# Patient Record
Sex: Female | Born: 1995 | Race: Black or African American | Hispanic: No | Marital: Single | Smoking: Current every day smoker
Health system: Southern US, Community
[De-identification: ages and names within clinical notes are randomized; demographics above are authoritative.]

## PROBLEM LIST (undated history)

## (undated) ENCOUNTER — Inpatient Hospital Stay (HOSPITAL_COMMUNITY): Payer: Self-pay

## (undated) DIAGNOSIS — N390 Urinary tract infection, site not specified: Secondary | ICD-10-CM

## (undated) HISTORY — PX: NO PAST SURGERIES: SHX2092

---
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
## 2013-02-12 ENCOUNTER — Emergency Department (HOSPITAL_COMMUNITY)
Admission: EM | Admit: 2013-02-12 | Discharge: 2013-02-12 | Disposition: A | Discharge status: Home / Self Care | Payer: Medicaid Other | Attending: Emergency Medicine | Admitting: Emergency Medicine

## 2013-02-12 ENCOUNTER — Encounter (HOSPITAL_COMMUNITY): Payer: Self-pay | Admitting: Emergency Medicine

## 2013-02-12 DIAGNOSIS — F17200 Nicotine dependence, unspecified, uncomplicated: Secondary | ICD-10-CM | POA: Insufficient documentation

## 2013-02-12 DIAGNOSIS — R51 Headache: Secondary | ICD-10-CM | POA: Insufficient documentation

## 2013-02-12 DIAGNOSIS — J029 Acute pharyngitis, unspecified: Secondary | ICD-10-CM

## 2013-02-12 DIAGNOSIS — Z87440 Personal history of urinary (tract) infections: Secondary | ICD-10-CM | POA: Insufficient documentation

## 2013-02-12 DIAGNOSIS — Z3202 Encounter for pregnancy test, result negative: Secondary | ICD-10-CM | POA: Insufficient documentation

## 2013-02-12 DIAGNOSIS — R6883 Chills (without fever): Secondary | ICD-10-CM | POA: Insufficient documentation

## 2013-02-12 DIAGNOSIS — H9209 Otalgia, unspecified ear: Secondary | ICD-10-CM | POA: Insufficient documentation

## 2013-02-12 LAB — RAPID STREP SCREEN (MED CTR MEBANE ONLY): Streptococcus, Group A Screen (Direct): NEGATIVE

## 2013-02-12 LAB — POCT PREGNANCY, URINE: PREG TEST UR: NEGATIVE

## 2013-02-12 MED ORDER — AMOXICILLIN 500 MG PO CAPS: 500.0000 mg | ORAL_CAPSULE | Freq: Three times a day (TID) | ORAL | Status: DC

## 2013-02-12 MED ORDER — IBUPROFEN 800 MG PO TABS: 800.0000 mg | ORAL_TABLET | Freq: Three times a day (TID) | ORAL | Status: DC

## 2013-02-12 NOTE — ED Provider Notes (Signed)
CSN: [id number]     Arrival date & time 02/12/13  1759 History   None    This chart was scribed for non-physician practitioner, Jaynie Crumbleatyana Josceline Chenard, PA-C, working with Leonette Mostharles B. Bernette MayersSheldon, MD by Arlan OrganAshley Leger, ED Scribe. This patient was seen in room TR09C/TR09C and the patient's care was started at 8:36 PM.   Chief Complaint  Patient presents with  . swollen tonsils    The history is provided by the patient. No language interpreter was used.    HPI Comments: Destiny Wu is a 18 y.o. female who presents to the Emergency Department complaining of bilateral swelling to the tonsils with associated diffuse facial pain described as sharp and stabbing that initially started 3 days. She states her symptoms started with a sore throat that gradually progressed into her current symptoms. She also reports mild dysphagia, mild chills, and mild otalgia. Pt states eating exacerbates her pain, and she denies any alleviating factors. She has tried 2 packs of BC powder, chloraseptic spray, and tylenol with no improvement. She denies any nasal congestion, fever, rhinorrhea, or cough. She denies ever having mononucleosis. Denies any known antibiotics. Pt has no other pertinent medical history, and no other complaints as this time.   Past Medical History  Diagnosis Date  . UTI (urinary tract infection)    Past Surgical History  Procedure Laterality Date  . No past surgeries     Family History  Problem Relation Age of Onset  . Diabetes Maternal Grandmother    History  Substance Use Topics  . Smoking status: Current Every Day Smoker -- 0.50 packs/day for 1 years    Types: Cigarettes  . Smokeless tobacco: Not on file  . Alcohol Use: No   OB History   Grav Para Term Preterm Abortions TAB SAB Ect Mult Living   1 1  1       0     Review of Systems  HENT: Negative for congestion, postnasal drip and rhinorrhea.        Swollen tonsils  Respiratory: Negative for cough.     Allergies  Review of  patient's allergies indicates no known allergies.  Home Medications   Current Outpatient Rx  Name  Route  Sig  Dispense  Refill  . acetaminophen (TYLENOL) 500 MG tablet   Oral   Take 1,000 mg by mouth every 6 (six) hours as needed for mild pain.         . Aspirin-Salicylamide-Caffeine (BC HEADACHE POWDER PO)   Oral   Take 1 packet by mouth daily as needed (for pain).         Marland Kitchen. ibuprofen (ADVIL,MOTRIN) 200 MG tablet   Oral   Take 400 mg by mouth daily as needed for moderate pain.           Triage Vitals: BP 120/60  Pulse 78  Temp(Src) 99.5 F (37.5 C) (Oral)  Resp 18  Wt 198 lb (89.812 kg)  SpO2 100%  Physical Exam  Nursing note and vitals reviewed. Constitutional: She is oriented to person, place, and time. She appears well-developed and well-nourished.  HENT:  Head: Normocephalic and atraumatic.  External ears normal bilaterally TMs normal bilaterally  No nasal congestion Tonsils bilaterally erythematous,  right greater than left White exudate present on right tonsil Uvula midline Tender anterior cervical lymphadenopathy   Eyes: EOM are normal.  Neck: Normal range of motion.  Cardiovascular: Normal rate and regular rhythm.   Pulmonary/Chest: Effort normal.  Musculoskeletal: Normal range of motion.  Neurological:  She is alert and oriented to person, place, and time.  Skin: Skin is warm and dry.  Psychiatric: She has a normal mood and affect. Her behavior is normal.    ED Course  Procedures (including critical care time)  DIAGNOSTIC STUDIES: Oxygen Saturation is 100% on RA, Normal by my interpretation.    COORDINATION OF CARE: 8:37 PM- Will put on antibiotic. Encouraged pt to continue to soak throat with warm water. Discussed treatment plan with pt at bedside and pt agreed to plan.     Labs Review Labs Reviewed  RAPID STREP SCREEN  CULTURE, GROUP A STREP  POCT PREGNANCY, URINE   Imaging Review No results found.  EKG Interpretation   None        MDM   1. Pharyngitis     Patient here for sore throat, also wants to know if she's pregnant. Patient exam is positive for slightly enlarged  right tonsil, no signs of peritonsillar abscess given there is no change in voice, her tonsils are still minimally enlarged, and the uvula is midline. Strep screen was obtained and is negative. Because patient has low-grade fever, tender anterior cervical lymphadenopathy, exudative tonsillitis, will treat with antibiotic. Patient's urine pregnancy is negative. Advised to take ibuprofen and Tylenol for pain, salt water gargles, Chloraseptic throat spray. She'll be started on amoxicillin. She is to followup with her primary care Dr.   Ceasar Mons Vitals:   02/12/13 1808  BP: 120/60  Pulse: 78  Temp: 99.5 F (37.5 C)  TempSrc: Oral  Resp: 18  Weight: 198 lb (89.812 kg)  SpO2: 100%    I personally performed the services described in this documentation, which was scribed in my presence. The recorded information has been reviewed and is accurate.   Lottie Mussel, PA-C 02/13/13 [phone]

## 2013-02-12 NOTE — ED Notes (Signed)
Pt c/o swollen tonsils for 2 days with total face pain.  No temp

## 2013-02-12 NOTE — Discharge Instructions (Signed)
Ibuprofen for pain. Amoxicillin for infection until all gone. Salt water gargles to help with pain and inflammation. Chloroseptic throat spray. Follow up with your doctor.   Pharyngitis Pharyngitis is redness, pain, and swelling (inflammation) of your pharynx.  CAUSES  Pharyngitis is usually caused by infection. Most of the time, these infections are from viruses (viral) and are part of a cold. However, sometimes pharyngitis is caused by bacteria (bacterial). Pharyngitis can also be caused by allergies. Viral pharyngitis may be spread from person to person by coughing, sneezing, and personal items or utensils (cups, forks, spoons, toothbrushes). Bacterial pharyngitis may be spread from person to person by more intimate contact, such as kissing.  SIGNS AND SYMPTOMS  Symptoms of pharyngitis include:   Sore throat.   Tiredness (fatigue).   Low-grade fever.   Headache.  Joint pain and muscle aches.  Skin rashes.  Swollen lymph nodes.  Plaque-like film on throat or tonsils (often seen with bacterial pharyngitis). DIAGNOSIS  Your health care provider will ask you questions about your illness and your symptoms. Your medical history, along with a physical exam, is often all that is needed to diagnose pharyngitis. Sometimes, a rapid strep test is done. Other lab tests may also be done, depending on the suspected cause.  TREATMENT  Viral pharyngitis will usually get better in 3 4 days without the use of medicine. Bacterial pharyngitis is treated with medicines that kill germs (antibiotics).  HOME CARE INSTRUCTIONS   Drink enough water and fluids to keep your urine clear or pale yellow.   Only take over-the-counter or prescription medicines as directed by your health care provider:   If you are prescribed antibiotics, make sure you finish them even if you start to feel better.   Do not take aspirin.   Get lots of rest.   Gargle with 8 oz of salt water ( tsp of salt per 1 qt of  water) as often as every 1 2 hours to soothe your throat.   Throat lozenges (if you are not at risk for choking) or sprays may be used to soothe your throat. SEEK MEDICAL CARE IF:   You have large, tender lumps in your neck.  You have a rash.  You cough up green, yellow-brown, or bloody spit. SEEK IMMEDIATE MEDICAL CARE IF:   Your neck becomes stiff.  You drool or are unable to swallow liquids.  You vomit or are unable to keep medicines or liquids down.  You have severe pain that does not go away with the use of recommended medicines.  You have trouble breathing (not caused by a stuffy nose). MAKE SURE YOU:   Understand these instructions.  Will watch your condition.  Will get help right away if you are not doing well or get worse. Document Released: 01/09/2005 Document Revised: 10/30/2012 Document Reviewed: 09/16/2012 [facility] Patient Information 2014 [employer], [place].

## 2013-02-13 NOTE — ED Provider Notes (Signed)
Medical screening examination/treatment/procedure(s) were performed by non-physician practitioner and as supervising physician I was immediately available for consultation/collaboration.  EKG Interpretation   None         Tikia Skilton B. Joeziah Voit, MD 02/13/13 0857 

## 2013-02-14 LAB — CULTURE, GROUP A STREP

## 2013-04-19 ENCOUNTER — Emergency Department (HOSPITAL_COMMUNITY)
Admission: EM | Admit: 2013-04-19 | Discharge: 2013-04-19 | Disposition: A | Discharge status: Home Health | Payer: Medicaid Other | Attending: Emergency Medicine | Admitting: Emergency Medicine

## 2013-04-19 ENCOUNTER — Encounter (HOSPITAL_COMMUNITY): Payer: Self-pay | Admitting: Emergency Medicine

## 2013-04-19 DIAGNOSIS — Z791 Long term (current) use of non-steroidal anti-inflammatories (NSAID): Secondary | ICD-10-CM | POA: Insufficient documentation

## 2013-04-19 DIAGNOSIS — R21 Rash and other nonspecific skin eruption: Secondary | ICD-10-CM

## 2013-04-19 DIAGNOSIS — Z792 Long term (current) use of antibiotics: Secondary | ICD-10-CM | POA: Insufficient documentation

## 2013-04-19 DIAGNOSIS — F17200 Nicotine dependence, unspecified, uncomplicated: Secondary | ICD-10-CM | POA: Insufficient documentation

## 2013-04-19 DIAGNOSIS — Z87440 Personal history of urinary (tract) infections: Secondary | ICD-10-CM | POA: Insufficient documentation

## 2013-04-19 MED ORDER — BETAMETHASONE DIPROPIONATE 0.05 % EX OINT: TOPICAL_OINTMENT | Freq: Two times a day (BID) | CUTANEOUS | Status: DC

## 2013-04-19 NOTE — Discharge Instructions (Signed)
Continue to use hydrocortisone cream (over the counter) on your rash  If you need a stronger steroid cream please fill prescribed prescription - do not use both OTC steroid and prescribed steroid cream - if you cannot afford the prescribed medication continue to use over the counter hydrocortisone daily (which will help) Continue to keep area moist to prevent cracking - try Aquaphor and Vaseline  Follow-up with a doctor - you will likely need dermatology referral  Return to the emergency department if you develop any changing/worsening condition, spreading redness/swelling, fever, severe pain, drainage, or any other concerns (please read additional information regarding your condition below)     Psoriasis Psoriasis is a common, long-lasting (chronic) inflammation of the skin. It affects both men and women equally, of all ages and all races. Psoriasis cannot be passed from person to person (not contagious). Psoriasis varies from mild to very severe. When severe, it can greatly affect your quality of life. Psoriasis is an inflammatory disorder affecting the skin as well as other organs including the joints (causing an arthritis). With psoriasis, the skin sheds its top layer of cells more rapidly than it does in someone without psoriasis. CAUSES  The cause of psoriasis is largely unknown. Genetics, your immune system, and the environment seem to play a role in causing psoriasis. Factors that can make psoriasis worse include:  Damage or trauma to the skin, such as cuts, scrapes, and sunburn. This damage often causes new areas of psoriasis (lesions).  Winter dryness and lack of sunlight.  Medicines such as lithium, beta-blockers, antimalarial drugs, ACE inhibitors, nonsteroidal anti-inflammatory drugs (ibuprofen, aspirin), and terbinafine. Let your caregiver know if you are taking any of these drugs.  Alcohol. Excessive alcohol use should be avoided if you have psoriasis. Drinking large amounts of  alcohol can affect:  How well your psoriasis treatment works.  How safe your psoriasis treatment is.  Smoking. If you smoke, ask your caregiver for help to quit.  Stress.  Bacterial or viral infections.  Arthritis. Arthritis associated with psoriasis (psoriatic arthritis) affects less than 10% of patients with psoriasis. The arthritic intensity does not always match the skin psoriasis intensity. It is important to let your caregiver know if your joints hurt or if they are stiff. SYMPTOMS  The most common form of psoriasis begins with little red bumps that gradually become larger. The bumps begin to form scales that flake off easily. The lower layers of scales stick together. When these scales are scratched or removed, the underlying skin is tender and bleeds easily. These areas then grow in size and may become large. Psoriasis often creates a rash that looks the same on both sides of the body (symmetrical). It often affects the elbows, knees, groin, genitals, arms, legs, scalp, and nails. Affected nails often have pitting, loosen, thicken, crumble, and are difficult to treat.  "Inverse psoriasis"occurs in the armpits, under breasts, in skin folds, and around the groin, buttocks, and genitals.  "Guttate psoriasis" generally occurs in children and young adults following a recent sore throat (strep throat). It begins with many small, red, scaly spots on the skin. It clears spontaneously in weeks or a few months without treatment. DIAGNOSIS  Psoriasis is diagnosed by physical exam. A tissue sample (biopsy) may also be taken. TREATMENT The treatment of psoriasis depends on your age, health, and living conditions.  Steroid (cortisone) creams, lotions, and ointments may be used. These treatments are associated with thinning of the skin, blood vessels that get larger (dilated), loss  of skin pigmentation, and easy bruising. It is important to use these steroids as directed by your caregiver. Only  treat the affected areas and not the normal, unaffected skin. People on long-term steroid treatment should wear a medical alert bracelet. Injections may be used in areas that are difficult to treat.  Scalp treatments are available as shampoos, solutions, sprays, foams, and oils. Avoid scratching the scalp and picking at the scales.  Anthralin medicine works well on areas that are difficult to treat. However, it stains clothes and skin and may cause temporary irritation.  Synthetic vitamin D (calcipotriene)can be used on small areas. It is available by prescription. The forms of synthetic vitamin D available in health food stores do not help with psoriasis.  Coal tarsare available in various strengths for psoriasis that is difficult to treat. They are one of the longest used treatments for difficult to treat psoriasis. However, they are messy to use.  Light therapy (UV therapy) can be carefully and professionally monitored in a dermatologist's office. Careful sunbathing is helpful for many people as directed by your caregiver. The exposure should be just long enough to cause a mild redness (erythema) of your skin. Avoid sunburn as this may make the condition worse. Sunscreen (SPF of 30 or higher) should be used to protect against sunburn. Cataracts, wrinkles, and skin aging are some of the harmful side effects of light therapy.  If creams (topical medicines) fail, there are several other options for systemic or oral medicines your caregiver can suggest. Psoriasis can sometimes be very difficult to treat. It can come and go. It is necessary to follow up with your caregiver regularly if your psoriasis is difficult to treat. Usually, with persistence you can get a good amount of relief. Maintaining consistent care is important. Do not change caregivers just because you do not see immediate results. It may take several trials to find the right combination of treatment for you. PREVENTING FLARE-UPS  Wear  gloves while you wash dishes, while cleaning, and when you are outside in the cold.  If you have radiators, place a bowl of water or damp towel on the radiator. This will help put water back in the air. You can also use a humidifier to keep the air moist. Try to keep the humidity at about 60% in your home.  Apply moisturizer while your skin is still damp from bathing or showering. This traps water in the skin.  Avoid long, hot baths or showers. Keep soap use to a minimum. Soaps dry out the skin and wash away the protective oils. Use a fragrance free, dye free soap.  Drink enough water and fluids to keep your urine clear or pale yellow. Not drinking enough water depletes your skin's water supply.  Turn off the heat at night and keep it low during the day. Cool air is less drying. SEEK MEDICAL CARE IF:  You have increasing pain in the affected areas.  You have uncontrolled bleeding in the affected areas.  You have increasing redness or warmth in the affected areas.  You start to have pain or stiffness in your joints.  You start feeling depressed about your condition.  You have a fever. Document Released: 01/07/2000 Document Revised: 04/03/2011 Document Reviewed: 07/04/2010 [facility] Patient Information 2014 [place], [place].  Rash A rash is a change in the color or texture of your skin. There are many different types of rashes. You may have other problems that accompany your rash. CAUSES   Infections.  Allergic  reactions. This can include allergies to pets or foods.  Certain medicines.  Exposure to certain chemicals, soaps, or cosmetics.  Heat.  Exposure to poisonous plants.  Tumors, both cancerous and noncancerous. SYMPTOMS   Redness.  Scaly skin.  Itchy skin.  Dry or cracked skin.  Bumps.  Blisters.  Pain. DIAGNOSIS  Your caregiver may do a physical exam to determine what type of rash you have. A skin sample (biopsy) may be taken and examined under a  microscope. TREATMENT  Treatment depends on the type of rash you have. Your caregiver may prescribe certain medicines. For serious conditions, you may need to see a skin doctor (dermatologist). HOME CARE INSTRUCTIONS   Avoid the substance that caused your rash.  Do not scratch your rash. This can cause infection.  You may take cool baths to help stop itching.  Only take over-the-counter or prescription medicines as directed by your caregiver.  Keep all follow-up appointments as directed by your caregiver. SEEK IMMEDIATE MEDICAL CARE IF:  You have increasing pain, swelling, or redness.  You have a fever.  You have new or severe symptoms.  You have body aches, diarrhea, or vomiting.  Your rash is not better after 3 days. MAKE SURE YOU:  Understand these instructions.  Will watch your condition.  Will get help right away if you are not doing well or get worse. Document Released: 12/30/2001 Document Revised: 04/03/2011 Document Reviewed: 10/24/2010 [facility] Patient Information 2014 [place], [place].   Emergency Department Resource Guide 1) Find a Doctor and Pay Out of Pocket Although you won't have to find out who is covered by your insurance plan, it is a good idea to ask around and get recommendations. You will then need to call the office and see if the doctor you have chosen will accept you as a new patient and what types of options they offer for patients who are self-pay. Some doctors offer discounts or will set up payment plans for their patients who do not have insurance, but you will need to ask so you aren't surprised when you get to your appointment.  2) Contact Your Local Health Department Not all health departments have doctors that can see patients for sick visits, but many do, so it is worth a call to see if yours does. If you don't know where your local health department is, you can check in your phone book. The CDC also has a tool to help you locate your state's  health department, and many state websites also have listings of all of their local health departments.  3) Find a Walk-in Clinic If your illness is not likely to be very severe or complicated, you may want to try a walk in clinic. These are popping up all over the country in pharmacies, drugstores, and shopping centers. They're usually staffed by nurse practitioners or physician assistants that have been trained to treat common illnesses and complaints. They're usually fairly quick and inexpensive. However, if you have serious medical issues or chronic medical problems, these are probably not your best option.  No Primary Care Doctor: - Call Health Connect at  [phone] - they can help you locate a primary care doctor that  accepts your insurance, provides certain services, etc. - Physician Referral Service- [phone]  Chronic Pain Problems: Organization         Address  Phone   Notes  [facility]  [phone] Patients need to be referred by their primary care doctor.  Medication Assistance: Organization         Address  Phone   Notes  [facility] [address] [place] [place], [place] [address] [phone] --Must be a resident of [facility] -- Must have NO insurance coverage whatsoever (no Medicaid/ Medicare, etc.) -- The pt. MUST have a primary care doctor that directs their care regularly and follows them in the community   [facility]ist  [phone]   [employer]  [phone]    Agencies that provide inexpensive medical care: Organization         Address  Phone   Notes  [facility]  [phone]   [facility]    [phone]   [facility] [address] [place], [place] [address] [phone]   [facility] [address] [place] [place] [phone]   Planned Parenthood    [phone]   [facility]    [phone]    [facility]  [address], [place] Phone:  [phone], Fax:  [phone] Hours of Operation:  9 am - 6 pm, M-F.  Also accepts Medicaid/Medicare and self-pay.  [facility]  [address], [place] Phone: [phone], Fax: [phone]. Hours of Operation:  8:30 am - 5:30 pm, M-F.  Also accepts Medicaid and self-pay.  [facility] [place] [address], [place] Phone: [phone]   [facility] [address] Natasha Bence [place], [place] [phone], Ext. [phone] Mondays & Thursdays: 7-9 AM.  First 15 patients are seen on a first come, first serve basis.    Medicaid-accepting [facility] Providers:  Organization         Address  Phone   Notes  [facility] [address], [place] [phone] Also accepts self-pay patients.  [facility] [address] Laurell Josephs [place], [place]  [phone]   [facility] [address], [place] [phone]   [facility] [address], [place] [phone]   Renaye Rakers [address], [place]   [phone] Only accepts [place] Access [place] patients after they have their name applied to their card.   Self-Pay (no insurance) in [facility]:  Organization         Address  Phone   Notes  Sickle Cell Patients, [facility] [address] [place], [place] [phone]   [facility] [address] [place], [place] [phone]   [facility] [place]  [address], [place] [phone]   [facility]/Dr. Osei-Bonsu  [address] [place] or [address], [place] [phone] Phone number for both [place] and [place] locations is the same.  [facility]gent Medical and [facility] [address], [place] [phone]     [facility] [address] [place] or [address] [phone]   [facility] [address] [place] [phone], phone; [phone], fax Sees patients 1st and 3rd Saturday of every month.  Must not qualify for public or private insurance (i.e. Medicaid, Medicare, [place] Health Choice, Veterans' Benefits)  Household income should be no more than 200% of the poverty level  The clinic cannot treat you if you are pregnant or think you are pregnant  Sexually transmitted diseases are not treated at the clinic.    Dental Care: Organization         Address  Phone  Notes  [facility] [address] [place], [place] [phone] Accepts children up to age 79 who are enrolled in [place] or [place] Health Choice; pregnant women with a Medicaid card; and children who have applied for Medicaid or [place] Health Choice, but were declined, whose parents can pay a reduced fee at time of service.  [facility]  [address], [place] [phone] Accepts children up to age 66 who are enrolled in [place] or [place] Health Choice; pregnant women with a Medicaid card; and children who have applied for Medicaid or [place] Health Choice, but were declined, whose parents can pay a reduced fee at time of service.  [facility]  [address] [place], [place] [phone] Patients are seen by appointment only. Walk-ins are not accepted. [facility] will see patients 49 years of age and older. Monday - Tuesday (8am-5pm) Most Wednesdays (8:30-5pm) $30 per visit, cash only  [facility] Adult Dental Access PRO[facility]  [address], [facility] [phone] Patients are seen by appointment only. Walk-ins are not accepted. [facility] will see patients 50 years of age and older. One Wednesday Evening (Monthly: Volunteer Based).   $30 per visit, cash only  [employer] [facility] [employer]  [phone] for adults; Children under age 21, call Graduate Pediatric Dentistry at [phone]. Children aged 70-14, please call [phone] to request a pediatric application.  Dental services are provided in all areas of dental care including fillings, crowns and bridges, complete and partial dentures, implants, gum treatment, root canals, and extractions. Preventive care is also provided. Treatment is provided to both adults and children. Patients are selected via a lottery and there is often a waiting list.   [facility] [address] [place]  [phone] [email or web address]   [facility] [address] [place], [place] [phone], Ext. [phone] Second and Fourth Thursday of each month, opens at 6:30 AM; Clinic ends at 9 AM.  Patients are seen on a first-come first-served basis, and a limited number are seen during each clinic.   [facility]  [address] Ether [place], [place] [phone]   Eligibility Requirements You must have lived in [place], [place], or [place] counties for at least the last three months.   You cannot be eligible for state or federal sponsored [place], including [employer], [place], or [employer].   You generally cannot be eligible for healthcare insurance through your employer.    How to apply: Eligibility screenings are held every Tuesday and Wednesday afternoon from 1:00 pm until 4:00 pm. You do not need an appointment for the interview!  [facility] [address], [place], [place] [phone]   [facility] Health Department  [phone]   [facility]  [phone]   [facility] Department  [phone]    Behavioral Health Resources in the Community: Intensive Outpatient Programs Organization         Address  Phone  Notes  [facility] Services [address] [place], [place] [phone]   [facility] [address],  [place], [place] [phone]   [facility] [address] [place], [place]  [phone]   [facility] [address]  [place], [place] [phone] or [phone]   Substance Abuse Resources Organization         Address  Phone  Notes  [facility]  [phone]   [facility]  [phone]   [facility] [place]  [phone]   Floydene Flock  [phone]   Residential & Outpatient Substance Abuse Program  [phone]   Psychological Services Organization         Address  Phone  Notes  [facility]  [phone]   [facility]  [phone]   [facility] Health [address], [place] [phone] or [phone]    Mobile Crisis Teams Organization         Address  Phone  Notes  [facility], Mobile Crisis Care Unit  [phone]   [facility]  [address] [place], [place] [phone]   Doristine Locks [address] [place] [phone]    Self-Help/Support Groups Organization         Address  Phone             Notes  [facility] [place] - variety of support groups  336- I7437963 Call for more information  Narcotics Anonymous (NA), [facility] [address], [employer] [place]  2 meetings at this location   Statistician         Address  Phone  Notes  [facility] [address],    [place]  [phone]   [facility]  [address] [place] [address], [place], [place] [phone]   [facility] [address] [place], [place] [phone] Admissions: 8am-3pm M-F  [facility] [address],    [place], [place] [phone]   [facility] [address] [place], [place], [place] [phone]   [facility] [address],  [place], [place] [phone]   [facility] - Intensive Outpatient [address] Laurell Joseph[address], [place], [place] [phone]   [facility] ([facility] [address] [place]  [place], [place] [phone] or [phone]   [facility] ([facility]) [address] [place], [place] [phone] Accepts Medicaid  [facility] [place] [address],  [place] [phone] Substance Abuse/Addiction Treatment   [facility] Organization         Address  Phone  Notes  [facility]  [phone]   Angie Fava, PhD [address] Ervin Knack [place], [place]   [phone] or [phone]   [facility]   [address] [place], [place] [phone]   [facility] [address] [place], [place] [phone] Insurance/Medicaid/sponsorship through [facility] Families [address]                                    [place], [place] [phone] Therapy/tele-psych/case  [facility] [address], [place] [phone]    Dr. Lolly Mustache  [phone]   [facility]  [employer] [facility] Dept. 1) [address] [place] 2) [address], [place] 3)  [address], [place] [phone])  [phone]) [phone]   °[place] Child Abuse Hotline (336) 342-1394 or (336) [phone] (After Hours)    ° ° °

## 2013-04-19 NOTE — ED Provider Notes (Signed)
CSN: [id number]     Arrival date & time 04/19/13  1918 History  This chart was scribed for non-physician practitioner, Coral Ceo, PA-C working with Nelia Shi, MD by Greggory Stallion, ED scribe. This patient was seen in room TR11C/TR11C and the patient's care was started at 9:42 PM.   Chief Complaint  Patient presents with  . Rash   The history is provided by the patient. No language interpreter was used.   HPI Comments: Destiny Wu is a 18 y.o. female who presents to the Emergency Department complaining of a dry, itchy rash to bilateral hands that started one year ago but recently worsened. Denies pus drainage but states there has been an intermittent clear, sticky drainage. Denies significant pain. No redness or swelling. Pt was seen in November (12/22/12) for the same and given a cream (hydrocortisone) and antibiotics (keflex). She states she "lost the prescriptions" and did not follow-up with anyone after discharge. Pt has used OTC hydrocortisone cream with no relief. States that the rash has not worsened but is spreading to new areas on her wrists. Denies rash in other locations. Denies fever, mouth sores, sore throat, nausea, emesis, or diarrhea.      Past Medical History  Diagnosis Date  . UTI (urinary tract infection)    Past Surgical History  Procedure Laterality Date  . No past surgeries     Family History  Problem Relation Age of Onset  . Diabetes Maternal Grandmother    History  Substance Use Topics  . Smoking status: Current Every Day Smoker -- 0.50 packs/day for 1 years    Types: Cigarettes  . Smokeless tobacco: Not on file  . Alcohol Use: No   OB History   Grav Para Term Preterm Abortions TAB SAB Ect Mult Living   1 1  1       0     Review of Systems  Constitutional: Negative for fever, chills, diaphoresis, activity change, appetite change and fatigue.  HENT: Negative for facial swelling, mouth sores and sore throat.   Respiratory: Negative for  cough, shortness of breath and wheezing.   Gastrointestinal: Negative for nausea, vomiting and abdominal pain.  Skin: Positive for rash.  Neurological: Negative for dizziness, weakness, light-headedness and headaches.  All other systems reviewed and are negative.   Allergies  Review of patient's allergies indicates no known allergies.  Home Medications   Current Outpatient Rx  Name  Route  Sig  Dispense  Refill  . acetaminophen (TYLENOL) 500 MG tablet   Oral   Take 1,000 mg by mouth every 6 (six) hours as needed for mild pain.         Marland Kitchen amoxicillin (AMOXIL) 500 MG capsule   Oral   Take 1 capsule (500 mg total) by mouth 3 (three) times daily.   30 capsule   0   . Aspirin-Salicylamide-Caffeine (BC HEADACHE POWDER PO)   Oral   Take 1 packet by mouth daily as needed (for pain).         Marland Kitchen ibuprofen (ADVIL,MOTRIN) 200 MG tablet   Oral   Take 400 mg by mouth daily as needed for moderate pain.          Marland Kitchen ibuprofen (ADVIL,MOTRIN) 800 MG tablet   Oral   Take 1 tablet (800 mg total) by mouth 3 (three) times daily.   21 tablet   0    BP 134/63  Pulse 86  Temp(Src) 97.9 F (36.6 C) (Oral)  Resp 18  Ht  5\' 5"  (1.651 m)  Wt 170 lb (77.111 kg)  BMI 28.29 kg/m2  SpO2 100%  LMP 03/31/2013  Filed Vitals:   04/19/13 1924  BP: 134/63  Pulse: 86  Temp: 97.9 F (36.6 C)  TempSrc: Oral  Resp: 18  Height: 5\' 5"  (1.651 m)  Weight: 170 lb (77.111 kg)  SpO2: 100%    Physical Exam  Nursing note and vitals reviewed. Constitutional: She is oriented to person, place, and time. She appears well-developed and well-nourished. No distress.  HENT:  Head: Normocephalic and atraumatic.  Right Ear: External ear normal.  Left Ear: External ear normal.  Mouth/Throat: Oropharynx is clear and moist.  No oral lesions or sores  Eyes: EOM are normal.  Neck: Neck supple. No tracheal deviation present.  Cardiovascular: Normal rate, regular rhythm and normal heart sounds.  Exam reveals  no gallop and no friction rub.   No murmur heard. Radial pulses present and equal bilaterally  Pulmonary/Chest: Effort normal and breath sounds normal. No respiratory distress. She has no wheezes. She has no rhonchi. She has no rales. She exhibits no tenderness.  Abdominal: Soft. She exhibits no distension. There is no tenderness.  Musculoskeletal: Normal range of motion. She exhibits no edema and no tenderness.       Hands: Grip strength 5/5 bilaterally  Neurological: She is alert and oriented to person, place, and time.  Sensation intact in the hands bilaterally  Skin: Skin is warm and dry. She is not diaphoretic.  Rough, patchy, dry, raised confluent hyper-pigmented plaques to the anterior wrists and proximal palms bilaterally. No open sores or drainage. Area non-tender to palpation. No erythema, edema, or warmth to the touch. No rash seen throughout.   Psychiatric: She has a normal mood and affect. Her behavior is normal.    ED Course  Procedures (including critical care time)  DIAGNOSTIC STUDIES: Oxygen Saturation is 100% on RA, normal by my interpretation.    COORDINATION OF CARE: 9:46 PM-Discussed treatment plan which includes psoriasis treatment with pt at bedside and pt agreed to plan. Advised pt to follow up with a dermatologist.   Labs Review Labs Reviewed - No data to display Imaging Review No results found.   EKG Interpretation None      MDM   Destiny Wu is a 18 y.o. female is a 18 y.o. female who presents to the Emergency Department complaining of a dry, itchy rash to bilateral hands that started one year ago but recently worsened. Etiology of rash possibly due to psoriasis. Patient has been applying OTC hydrocortisone cream with no relief. Will prescribe stronger topical steroid (batamethasone) due to thick plaques. No signs or symptoms of an infectious process/cellulitis/abcess/joint infection. Patient afebrile and non-toxic in appearance. Patient  neurovascularly intact. Encouraged patient to apply emollients to keep area moist and prevent cracking. Instructed patient to follow-up with PCP. She likely will require dermatology referral in the future. Return precautions, discharge instructions, and follow-up was discussed with the patient before discharge.     Discharge Medication List as of 04/19/2013  9:59 PM    START taking these medications   Details  betamethasone dipropionate (DIPROLENE) 0.05 % ointment Apply topically 2 (two) times daily., Starting 04/19/2013, Until Discontinued, Print        Final impressions: 1. Rash      Luiz Iron PA-C   I personally performed the services described in this documentation, which was scribed in my presence. The recorded information has been reviewed and is accurate.  Jillyn LedgerJessica K Carmelia Tiner, PA-C 04/21/13 [id number]

## 2013-04-19 NOTE — ED Notes (Signed)
MD at bedside. 

## 2013-04-19 NOTE — ED Notes (Signed)
Patient presents with "rash" on her hands.  Stated this started about 1 year ago but is now getting worse on her hands.

## 2013-04-26 NOTE — ED Provider Notes (Signed)
Medical screening examination/treatment/procedure(s) were performed by non-physician practitioner and as supervising physician I was immediately available for consultation/collaboration.   Destiny Shiobert L Sueko Dimichele, MD 04/26/13 [phone]

## 2013-05-15 ENCOUNTER — Emergency Department (HOSPITAL_COMMUNITY)
Admission: EM | Admit: 2013-05-15 | Discharge: 2013-05-15 | Disposition: A | Discharge status: Home / Self Care | Payer: Medicaid Other | Attending: Emergency Medicine | Admitting: Emergency Medicine

## 2013-05-15 ENCOUNTER — Encounter (HOSPITAL_COMMUNITY): Payer: Self-pay | Admitting: Emergency Medicine

## 2013-05-15 DIAGNOSIS — N61 Mastitis without abscess: Secondary | ICD-10-CM | POA: Insufficient documentation

## 2013-05-15 DIAGNOSIS — Z7982 Long term (current) use of aspirin: Secondary | ICD-10-CM | POA: Insufficient documentation

## 2013-05-15 DIAGNOSIS — R011 Cardiac murmur, unspecified: Secondary | ICD-10-CM | POA: Insufficient documentation

## 2013-05-15 DIAGNOSIS — F17200 Nicotine dependence, unspecified, uncomplicated: Secondary | ICD-10-CM | POA: Insufficient documentation

## 2013-05-15 DIAGNOSIS — Z791 Long term (current) use of non-steroidal anti-inflammatories (NSAID): Secondary | ICD-10-CM | POA: Insufficient documentation

## 2013-05-15 DIAGNOSIS — L0291 Cutaneous abscess, unspecified: Secondary | ICD-10-CM

## 2013-05-15 DIAGNOSIS — Z87440 Personal history of urinary (tract) infections: Secondary | ICD-10-CM | POA: Insufficient documentation

## 2013-05-15 DIAGNOSIS — IMO0002 Reserved for concepts with insufficient information to code with codable children: Secondary | ICD-10-CM | POA: Insufficient documentation

## 2013-05-15 DIAGNOSIS — Z79899 Other long term (current) drug therapy: Secondary | ICD-10-CM | POA: Insufficient documentation

## 2013-05-15 MED ORDER — CEPHALEXIN 500 MG PO CAPS: ORAL_CAPSULE | ORAL | Status: DC

## 2013-05-15 NOTE — ED Provider Notes (Signed)
CSN: [id number]     Arrival date & time 05/15/13  0912 History   This chart was scribed for non-physician practitioner working with Destiny Wu, by Tana ConchStephen Methvin ED Scribe. This patient was seen in TR10C/TR10C and the patient's care was started at 9:23 AM.   Chief Complaint  Patient presents with  . Abscess    The history is provided by the patient. No language interpreter was used.    HPI Comments: Destiny Wu is a 18 y.o. female who presents to the Emergency Department complaining of a "bump" under her right breast, "it hurt real bad". Pt states that it looks like it should drain and that she first noticed it when she was sleeping and woke herself up scratching at the "bump". She states that she does not know if she was bitten by something and that some pus has come out. While she was scratching at it yesterday, a scab came off and pus again "came out" and the bump has since decreased in size. She reports no h/o similar symptoms. Pt denies fever or any other symptoms.    Past Medical History  Diagnosis Date  . UTI (urinary tract infection)    Past Surgical History  Procedure Laterality Date  . No past surgeries     Family History  Problem Relation Age of Onset  . Diabetes Maternal Grandmother    History  Substance Use Topics  . Smoking status: Current Every Day Smoker -- 0.50 packs/day for 1 years    Types: Cigarettes  . Smokeless tobacco: Not on file  . Alcohol Use: No   OB History   Grav Para Term Preterm Abortions TAB SAB Ect Mult Living   1 1  1       0     Review of Systems  Constitutional: Negative for fever and chills.  Respiratory: Negative for shortness of breath.   Cardiovascular: Negative for chest pain.  Gastrointestinal: Negative for nausea, vomiting and abdominal pain.  Skin: Positive for rash.       Bump under right breast.  All other systems reviewed and are negative.     Allergies  Review of patient's allergies indicates no known  allergies.  Home Medications   Prior to Admission medications   Medication Sig Start Date End Date Taking? Authorizing Provider  acetaminophen (TYLENOL) 500 MG tablet Take 1,000 mg by mouth 3 (three) times daily as needed (pain).     Historical Provider, MD  Aspirin-Salicylamide-Caffeine (BC HEADACHE POWDER PO) Take 1 packet by mouth daily as needed (headaches/pain).     Historical Provider, MD  betamethasone dipropionate (DIPROLENE) 0.05 % ointment Apply topically 2 (two) times daily. 04/19/13   Jillyn LedgerJessica K Palmer, PA-C  ibuprofen (ADVIL,MOTRIN) 200 MG tablet Take 200 mg by mouth 2 (two) times daily as needed (pain).     Historical Provider, MD   BP 115/70  Pulse 89  Temp(Src) 98.9 F (37.2 C) (Oral)  Resp 18  SpO2 98%  LMP 03/31/2013 Physical Exam  Nursing note and vitals reviewed. Constitutional: She is oriented to person, place, and time. She appears well-developed and well-nourished. No distress.  HENT:  Head: Normocephalic and atraumatic.  Right Ear: External ear normal.  Left Ear: External ear normal.  Nose: Nose normal.  Mouth/Throat: Oropharynx is clear and moist.  Eyes: Conjunctivae are normal.  Neck: Normal range of motion.  Cardiovascular: Normal rate, regular rhythm and normal heart sounds.   Pulmonary/Chest: Effort normal and breath sounds normal. No stridor. No respiratory  distress. She has no wheezes. She has no rales.    Abdominal: Soft. She exhibits no distension.  Musculoskeletal: Normal range of motion.  Neurological: She is alert and oriented to person, place, and time. She has normal strength.  Skin: Skin is warm and dry. She is not diaphoretic. No erythema.  Right breast.  1 cm area of fluctuance w/o induration No erythema or streaking. No nipple involvement.  Psychiatric: She has a normal mood and affect. Her behavior is normal.    ED Course  Procedures (including critical care time) DIAGNOSTIC STUDIES: Oxygen Saturation is 98% on RA, normal by my  interpretation.    COORDINATION OF CARE:   9:27 AM-Discussed treatment plan which includes draining the abscess with pt at bedside and pt agreed to plan.   Labs Review Labs Reviewed - No data to display  Imaging Review No results found.   EKG Interpretation None       INCISION AND DRAINAGE Performed by: Mora BellmanHannah S Amun Stemm Consent: Verbal consent obtained. Risks and benefits: risks, benefits and alternatives were discussed Type: abscess  Body area: right nipple  Anesthesia: local infiltration  Incision was made with a scalpel.  Local anesthetic: lidocaine 2%   Anesthetic total: 2 ml  Complexity: complex Blunt dissection to break up loculations  Drainage: purulent  Drainage amount: copious   Patient tolerance: Patient tolerated the procedure well with no immediate complications.     MDM   Final diagnoses:  Abscess   Patient with skin abscess amenable to incision and drainage.  Abscess was not large enough to warrant packing or drain,  wound recheck in 2 days. Encouraged home warm soaks and flushing.  Mild signs of cellulitis is surrounding skin.  Rx for Keflex. Will d/c to home.  Given location of abscess an ultrasound at breast center was ordered. Patient will call breast center to schedule appointment.   I personally performed the services described in this documentation, which was scribed in my presence. The recorded information has been reviewed and is accurate.      Mora BellmanHannah S Shakira Los, PA-C 05/15/13 0945  Mora BellmanHannah S Xai Frerking, PA-C 05/15/13 1513

## 2013-05-15 NOTE — ED Notes (Signed)
PA at bedside to drain abscess

## 2013-05-15 NOTE — Discharge Instructions (Signed)

## 2013-05-15 NOTE — ED Notes (Signed)
Patient has abscess on R breast.   Patient states she first noticed a few days ago, and it has formed a scab at that time.  Patient states is swollen and red at this time.

## 2013-05-17 NOTE — ED Provider Notes (Signed)
Medical screening examination/treatment/procedure(s) were performed by non-physician practitioner and as supervising physician I was immediately available for consultation/collaboration.    Jadine Brumley L Lorik Guo, MD 05/17/13 1003 

## 2013-11-24 ENCOUNTER — Encounter (HOSPITAL_COMMUNITY): Payer: Self-pay | Admitting: Emergency Medicine

## 2014-01-18 ENCOUNTER — Emergency Department (HOSPITAL_COMMUNITY)
Admission: EM | Admit: 2014-01-18 | Discharge: 2014-01-18 | Disposition: A | Discharge status: Other Acute Inpatient Hospital | Payer: Medicaid Other

## 2014-01-19 ENCOUNTER — Encounter (HOSPITAL_COMMUNITY): Payer: Self-pay

## 2014-01-19 ENCOUNTER — Emergency Department (HOSPITAL_COMMUNITY)
Admission: EM | Admit: 2014-01-19 | Discharge: 2014-01-19 | Disposition: A | Discharge status: Home / Self Care | Payer: Self-pay | Source: Home / Self Care | Attending: Emergency Medicine | Admitting: Emergency Medicine

## 2014-01-19 DIAGNOSIS — L03115 Cellulitis of right lower limb: Secondary | ICD-10-CM

## 2014-01-19 LAB — POCT PREGNANCY, URINE: PREG TEST UR: NEGATIVE

## 2014-01-19 MED ORDER — SULFAMETHOXAZOLE-TRIMETHOPRIM 800-160 MG PO TABS: 1.0000 | ORAL_TABLET | Freq: Two times a day (BID) | ORAL | Status: AC

## 2014-01-19 MED ORDER — MUPIROCIN CALCIUM 2 % EX CREA: 1.0000 "application " | TOPICAL_CREAM | Freq: Two times a day (BID) | CUTANEOUS | Status: DC

## 2014-01-19 NOTE — ED Notes (Signed)
C/o noted problem on leg ~ 2-3 days ago. LMP late November, sexually active w w/o BC, attempting to concieve

## 2014-01-19 NOTE — ED Provider Notes (Signed)
CSN: [id number]     Arrival date & time 01/19/14  1107 History   First MD Initiated Contact with Patient 01/19/14 1149     Chief Complaint  Patient presents with  . Skin Problem   (Consider location/radiation/quality/duration/timing/severity/associated sxs/prior Treatment) HPI Comments: 18 year old female presents with a tender red lesion to the right anterior thigh. She states she noticed a small pimple-like structure 3 days earlier. It has since increased in size and developed a vesicle in the center surrounded by light erythema and induration. On the way to the urgent care she states the vesicle burst.   Past Medical History  Diagnosis Date  . UTI (urinary tract infection)    Past Surgical History  Procedure Laterality Date  . No past surgeries     Family History  Problem Relation Age of Onset  . Diabetes Maternal Grandmother    History  Substance Use Topics  . Smoking status: Current Every Day Smoker -- 0.50 packs/day for 1 years    Types: Cigarettes  . Smokeless tobacco: Not on file  . Alcohol Use: No   OB History    Gravida Para Term Preterm AB TAB SAB Ectopic Multiple Living   1 1  1       0     Review of Systems  Constitutional: Negative.  Negative for fever.  Skin:       As per history of present illness  All other systems reviewed and are negative.   Allergies  Review of patient's allergies indicates no known allergies.  Home Medications   Prior to Admission medications   Medication Sig Start Date End Date Taking? Authorizing Provider  acetaminophen (TYLENOL) 500 MG tablet Take 1,000-1,500 mg by mouth every 6 (six) hours as needed for mild pain (pain).     Historical Provider, MD  mupirocin cream (BACTROBAN) 2 % Apply 1 application topically 2 (two) times daily. 01/19/14   Hayden Rasmussenavid Larkyn Greenberger, NP  sulfamethoxazole-trimethoprim (BACTRIM DS,SEPTRA DS) 800-160 MG per tablet Take 1 tablet by mouth 2 (two) times daily. 01/19/14 01/26/14  Hayden Rasmussenavid Lindia Garms, NP   BP 92/58 mmHg   Pulse 78  Temp(Src) 97.8 F (36.6 C) (Oral)  SpO2 99%  LMP 12/09/2013 (Exact Date) Physical Exam  Constitutional: She is oriented to person, place, and time. She appears well-developed and well-nourished. No distress.  Pulmonary/Chest: Effort normal. No respiratory distress.  Musculoskeletal: Normal range of motion. She exhibits no edema.  Neurological: She is alert and oriented to person, place, and time.  Skin: Skin is warm and dry. No rash noted. There is erythema.  To and asked centimeter annular lesion once a blister is ruptured over an area of broken skin. There is apparent healing by secondary intention beneath this blister membrane. There is approximately 3 cm in diameter of erythema with underlying induration of a total of 4-5 cm in diameter. According to the patient and significant other it appears to look "better" than it did yesterday. No lymphangitis. No current drainage. No exudate, no bleeding.  Psychiatric: She has a normal mood and affect.  Nursing note and vitals reviewed.   ED Course  Procedures (including critical care time) Labs Review Labs Reviewed  POCT PREGNANCY, URINE    Imaging Review No results found.   MDM   1. Cellulitis of right lower extremity    Topical mupirocin Septra ds Hygiene precautions Warm compresses RTO if worse. There is no area or drainage to collect for culture. The patient preferred that I did not disrupt membrane or  create an incision or other marked to obtain a culture or treatment. There is no evidence of abscess.     Hayden Rasmussenavid Declyn Delsol, NP 01/19/14 1220

## 2014-01-19 NOTE — Discharge Instructions (Signed)
Cellulitis Cellulitis is an infection of the skin and the tissue beneath it. The infected area is usually red and tender. Cellulitis occurs most often in the arms and lower legs.  CAUSES  Cellulitis is caused by bacteria that enter the skin through cracks or cuts in the skin. The most common types of bacteria that cause cellulitis are staphylococci and streptococci. SIGNS AND SYMPTOMS   Redness and warmth.  Swelling.  Tenderness or pain.  Fever. DIAGNOSIS  Your health care provider can usually determine what is wrong based on a physical exam. Blood tests may also be done. TREATMENT  Treatment usually involves taking an antibiotic medicine. HOME CARE INSTRUCTIONS   Take your antibiotic medicine as directed by your health care provider. Finish the antibiotic even if you start to feel better.  Keep the infected arm or leg elevated to reduce swelling.  Apply a warm cloth to the affected area up to 4 times per day to relieve pain.  Take medicines only as directed by your health care provider.  Keep all follow-up visits as directed by your health care provider. SEEK MEDICAL CARE IF:   You notice red streaks coming from the infected area.  Your red area gets larger or turns dark in color.  Your bone or joint underneath the infected area becomes painful after the skin has healed.  Your infection returns in the same area or another area.  You notice a swollen bump in the infected area.  You develop new symptoms.  You have a fever. SEEK IMMEDIATE MEDICAL CARE IF:   You feel very sleepy.  You develop vomiting or diarrhea.  You have a general ill feeling (malaise) with muscle aches and pains. MAKE SURE YOU:   Understand these instructions.  Will watch your condition.  Will get help right away if you are not doing well or get worse. Document Released: 10/19/2004 Document Revised: 05/26/2013 Document Reviewed: 03/27/2011 [facility] Patient Information 2015 [place], [place].  This information is not intended to replace advice given to you by your health care provider. Make sure you discuss any questions you have with your health care provider.  Abscess An abscess (boil or furuncle) is an infected area on or under the skin. This area is filled with yellowish-white fluid (pus) and other material (debris). HOME CARE   Only take medicines as told by your doctor.  If you were given antibiotic medicine, take it as directed. Finish the medicine even if you start to feel better.  If gauze is used, follow your doctor's directions for changing the gauze.  To avoid spreading the infection:  Keep your abscess covered with a bandage.  Wash your hands well.  Do not share personal care items, towels, or whirlpools with others.  Avoid skin contact with others.  Keep your skin and clothes clean around the abscess.  Keep all doctor visits as told. GET HELP RIGHT AWAY IF:   You have more pain, puffiness (swelling), or redness in the wound site.  You have more fluid or blood coming from the wound site.  You have muscle aches, chills, or you feel sick.  You have a fever. MAKE SURE YOU:   Understand these instructions.  Will watch your condition.  Will get help right away if you are not doing well or get worse. Document Released: 06/28/2007 Document Revised: 07/11/2011 Document Reviewed: 03/24/2011 [facility]Exit[facility] Patient Information 2015 [place], [place]. This information is not intended to replace advice given to you by your health care provider. Make sure  you discuss any questions you have with your health care provider.

## 2014-05-17 ENCOUNTER — Emergency Department (HOSPITAL_COMMUNITY): Payer: Medicaid Other

## 2014-05-17 ENCOUNTER — Emergency Department (HOSPITAL_COMMUNITY)
Admission: EM | Admit: 2014-05-17 | Discharge: 2014-05-17 | Disposition: A | Discharge status: Home / Self Care | Payer: Medicaid Other | Attending: Emergency Medicine | Admitting: Emergency Medicine

## 2014-05-17 ENCOUNTER — Encounter (HOSPITAL_COMMUNITY): Payer: Self-pay | Admitting: Emergency Medicine

## 2014-05-17 DIAGNOSIS — B9789 Other viral agents as the cause of diseases classified elsewhere: Secondary | ICD-10-CM

## 2014-05-17 DIAGNOSIS — J069 Acute upper respiratory infection, unspecified: Secondary | ICD-10-CM | POA: Insufficient documentation

## 2014-05-17 DIAGNOSIS — Z87440 Personal history of urinary (tract) infections: Secondary | ICD-10-CM | POA: Insufficient documentation

## 2014-05-17 DIAGNOSIS — Z792 Long term (current) use of antibiotics: Secondary | ICD-10-CM | POA: Insufficient documentation

## 2014-05-17 DIAGNOSIS — Z720 Tobacco use: Secondary | ICD-10-CM | POA: Insufficient documentation

## 2014-05-17 NOTE — ED Provider Notes (Signed)
CSN: [id number]     Arrival date & time 05/17/14  [id number] History  This chart was scribed for Tomasita CrumbleAdeleke Kaenan Jake, MD by Evon Slackerrance Branch, ED Scribe. This patient was seen in room A02C/A02C and the patient's care was started at 2:40 AM.    Chief Complaint  Patient presents with  . URI   Patient is a 19 y.o. female presenting with URI. The history is provided by the patient. No language interpreter was used.  URI Presenting symptoms: cough   Presenting symptoms: no congestion and no rhinorrhea   Severity:  Mild Onset quality:  Gradual Duration:  3 days Timing:  Constant Chronicity:  New Associated symptoms: headaches and myalgias   Risk factors: sick contacts    HPI Comments: Destiny Wu is a 19 y.o. female who presents to the Emergency Department complaining of URI symptoms onset 2-3 days. Pt states she has cough, chills, HA, and myalgias. Pt states she has recently been ariound her boyfriend who had similar symptoms. Pt states she has tried excedrin for her HA with slight relief. Pt states she has a Hx of seasonal allergies that she takes claritin for that has not provided any relief. Pt denies congestion, rhinorrhea or CP.   Past Medical History  Diagnosis Date  . UTI (urinary tract infection)    Past Surgical History  Procedure Laterality Date  . No past surgeries     Family History  Problem Relation Age of Onset  . Diabetes Maternal Grandmother    History  Substance Use Topics  . Smoking status: Current Every Day Smoker -- 0.50 packs/day for 1 years    Types: Cigarettes  . Smokeless tobacco: Not on file  . Alcohol Use: No   OB History    Gravida Para Term Preterm AB TAB SAB Ectopic Multiple Living   1 1  1       0      Review of Systems  Constitutional: Positive for chills.  HENT: Negative for congestion and rhinorrhea.   Respiratory: Positive for cough.   Cardiovascular: Negative for chest pain.  Musculoskeletal: Positive for myalgias.  Neurological: Positive  for headaches.  All other systems reviewed and are negative.   Allergies  Review of patient's allergies indicates no known allergies.  Home Medications   Prior to Admission medications   Medication Sig Start Date End Date Taking? Authorizing Provider  acetaminophen (TYLENOL) 500 MG tablet Take 1,000-1,500 mg by mouth every 6 (six) hours as needed for mild pain (pain).     Historical Provider, MD  mupirocin cream (BACTROBAN) 2 % Apply 1 application topically 2 (two) times daily. 01/19/14   Hayden Rasmussenavid Mabe, NP   BP 100/67 mmHg  Pulse 89  Temp(Src) 99 F (37.2 C) (Oral)  Resp 16  Ht 5\' 4"  (1.626 m)  Wt 206 lb 11.2 oz (93.759 kg)  BMI 35.46 kg/m2  SpO2 100%  LMP 04/19/2014 (Approximate)   Physical Exam  Constitutional: She is oriented to person, place, and time. She appears well-developed and well-nourished. No distress.  HENT:  Head: Normocephalic and atraumatic.  Nose: Nose normal.  Mouth/Throat: Oropharynx is clear and moist. No oropharyngeal exudate.  Eyes: Conjunctivae and EOM are normal. Pupils are equal, round, and reactive to light. No scleral icterus.  Neck: Normal range of motion. Neck supple. No JVD present. No tracheal deviation present. No thyromegaly present.  Cardiovascular: Normal rate, regular rhythm and normal heart sounds.  Exam reveals no gallop and no friction rub.   No murmur heard.  Pulmonary/Chest: Effort normal. No respiratory distress. She has wheezes. She exhibits no tenderness.  Wheezes right side posterior lobes.   Abdominal: Soft. Bowel sounds are normal. She exhibits no distension and no mass. There is no tenderness. There is no rebound and no guarding.  Musculoskeletal: Normal range of motion. She exhibits no edema or tenderness.  Lymphadenopathy:    She has no cervical adenopathy.  Neurological: She is alert and oriented to person, place, and time. No cranial nerve deficit. She exhibits normal muscle tone.  Skin: Skin is warm and dry. No rash noted. No  erythema. No pallor.  Nursing note and vitals reviewed.   ED Course  Procedures (including critical care time) DIAGNOSTIC STUDIES: Oxygen Saturation is 100% on RA, normal by my interpretation.    COORDINATION OF CARE: 2:52 AM-Discussed treatment plan with pt at bedside and pt agreed to plan.     Labs Review Labs Reviewed - No data to display  Imaging Review Dg Chest 2 View  05/17/2014   CLINICAL DATA:  Upper respiratory symptoms with sinus pressure and nonproductive cough.  EXAM: CHEST  2 VIEW  COMPARISON:  None.  FINDINGS: The heart size and mediastinal contours are within normal limits. Both lungs are clear. The visualized skeletal structures are unremarkable.  IMPRESSION: No active cardiopulmonary disease.   Electronically Signed   By: Davonna Belling M.D.   On: 05/17/2014 02:58     EKG Interpretation None      MDM   Final diagnoses:  None   Patient presents emergency department for upper respiratory symptoms over the past couple days. She has had sick contacts and her boyfriend who is in the room. She describes nonproductive cough sinus pressure and sore throat. Physical exam is abnormal for asymmetrical breath sounds. Chest x-ray does not show an acute pneumonia.  This is likely a viral URI. Education was given. Patient will resume Excedrin and Claritin at home as needed. Primary care follow-up was advised within 3 days. Her vital signs remain within her normal limits and she is safe for discharge.   I personally performed the services described in this documentation, which was scribed in my presence. The recorded information has been reviewed and is accurate.   Tomasita Crumble, MD 05/17/14 [phone]

## 2014-05-17 NOTE — ED Notes (Signed)
Patient here with complaint of upper respiratory symptoms. States sinus pressure, non-productive cough, and sore throat. Subjective fevers stated but not measured. Normothermic here. Significant other recently with similar illness, but states it wasn't as bad.

## 2014-05-17 NOTE — Discharge Instructions (Signed)
Upper Respiratory Infection, Adult Destiny Wu, your chest x-ray does not show a pneumonia. See a primary care physician within 3 days for close follow-up. If symptoms worsen come back to the emergency department immediately. Thank you. An upper respiratory infection (URI) is also known as the common cold. It is often caused by a type of germ (virus). Colds are easily spread (contagious). You can pass it to others by kissing, coughing, sneezing, or drinking out of the same glass. Usually, you get better in 1 or 2 weeks.  HOME CARE   Only take medicine as told by your doctor.  Use a warm mist humidifier or breathe in steam from a hot shower.  Drink enough water and fluids to keep your pee (urine) clear or pale yellow.  Get plenty of rest.  Return to work when your temperature is back to normal or as told by your doctor. You may use a face mask and wash your hands to stop your cold from spreading. GET HELP RIGHT AWAY IF:   After the first few days, you feel you are getting worse.  You have questions about your medicine.  You have chills, shortness of breath, or brown or red spit (mucus).  You have yellow or brown snot (nasal discharge) or pain in the face, especially when you bend forward.  You have a fever, puffy (swollen) neck, pain when you swallow, or white spots in the back of your throat.  You have a bad headache, ear pain, sinus pain, or chest pain.  You have a high-pitched whistling sound when you breathe in and out (wheezing).  You have a lasting cough or cough up blood.  You have sore muscles or a stiff neck. MAKE SURE YOU:   Understand these instructions.  Will watch your condition.  Will get help right away if you are not doing well or get worse. Document Released: 06/28/2007 Document Revised: 04/03/2011 Document Reviewed: 04/16/2013 [place] Patient Information 2015 [place], [place]. This information is not intended to replace advice given to you by your health care  provider. Make sure you discuss any questions you have with your health care provider.

## 2014-08-16 ENCOUNTER — Emergency Department (HOSPITAL_COMMUNITY)
Admission: EM | Admit: 2014-08-16 | Discharge: 2014-08-16 | Disposition: A | Discharge status: Home / Self Care | Payer: Medicaid Other | Attending: Emergency Medicine | Admitting: Emergency Medicine

## 2014-08-16 ENCOUNTER — Encounter (HOSPITAL_COMMUNITY): Payer: Self-pay

## 2014-08-16 DIAGNOSIS — Y9389 Activity, other specified: Secondary | ICD-10-CM | POA: Insufficient documentation

## 2014-08-16 DIAGNOSIS — L03312 Cellulitis of back [any part except buttock]: Secondary | ICD-10-CM

## 2014-08-16 DIAGNOSIS — Z79899 Other long term (current) drug therapy: Secondary | ICD-10-CM | POA: Insufficient documentation

## 2014-08-16 DIAGNOSIS — Y998 Other external cause status: Secondary | ICD-10-CM | POA: Insufficient documentation

## 2014-08-16 DIAGNOSIS — Y9289 Other specified places as the place of occurrence of the external cause: Secondary | ICD-10-CM | POA: Insufficient documentation

## 2014-08-16 DIAGNOSIS — W57XXXA Bitten or stung by nonvenomous insect and other nonvenomous arthropods, initial encounter: Secondary | ICD-10-CM | POA: Insufficient documentation

## 2014-08-16 DIAGNOSIS — Z87440 Personal history of urinary (tract) infections: Secondary | ICD-10-CM | POA: Insufficient documentation

## 2014-08-16 DIAGNOSIS — Z720 Tobacco use: Secondary | ICD-10-CM | POA: Insufficient documentation

## 2014-08-16 DIAGNOSIS — S30860A Insect bite (nonvenomous) of lower back and pelvis, initial encounter: Secondary | ICD-10-CM | POA: Insufficient documentation

## 2014-08-16 MED ORDER — DOXYCYCLINE HYCLATE 100 MG PO CAPS: 100.0000 mg | ORAL_CAPSULE | Freq: Two times a day (BID) | ORAL | Status: DC

## 2014-08-16 MED ORDER — HYDROCORTISONE 1 % EX CREA: TOPICAL_CREAM | CUTANEOUS | Status: DC

## 2014-08-16 NOTE — Discharge Instructions (Signed)
Cellulitis °Cellulitis is an infection of the skin and the tissue beneath it. The infected area is usually red and tender. Cellulitis occurs most often in the arms and lower legs.  °CAUSES  °Cellulitis is caused by bacteria that enter the skin through cracks or cuts in the skin. The most common types of bacteria that cause cellulitis are staphylococci and streptococci. °SIGNS AND SYMPTOMS  °· Redness and warmth. °· Swelling. °· Tenderness or pain. °· Fever. °DIAGNOSIS  °Your health care provider can usually determine what is wrong based on a physical exam. Blood tests may also be done. °TREATMENT  °Treatment usually involves taking an antibiotic medicine. °HOME CARE INSTRUCTIONS  °· Take your antibiotic medicine as directed by your health care provider. Finish the antibiotic even if you start to feel better. °· Keep the infected arm or leg elevated to reduce swelling. °· Apply a warm cloth to the affected area up to 4 times per day to relieve pain. °· Take medicines only as directed by your health care provider. °· Keep all follow-up visits as directed by your health care provider. °SEEK MEDICAL CARE IF:  °· You notice red streaks coming from the infected area. °· Your red area gets larger or turns dark in color. °· Your bone or joint underneath the infected area becomes painful after the skin has healed. °· Your infection returns in the same area or another area. °· You notice a swollen bump in the infected area. °· You develop new symptoms. °· You have a fever. °SEEK IMMEDIATE MEDICAL CARE IF:  °· You feel very sleepy. °· You develop vomiting or diarrhea. °· You have a general ill feeling (malaise) with muscle aches and pains. °MAKE SURE YOU:  °· Understand these instructions. °· Will watch your condition. °· Will get help right away if you are not doing well or get worse. °Document Released: 10/19/2004 Document Revised: 05/26/2013 Document Reviewed: 03/27/2011 °ExitCare® Patient Information ©2015 [employer], LLC.  This information is not intended to replace advice given to you by your health care provider. Make sure you discuss any questions you have with your health care provider. ° °Insect Bite °Mosquitoes, flies, fleas, bedbugs, and many other insects can bite. Insect bites are different from insect stings. A sting is when venom is injected into the skin. Some insect bites can transmit infectious diseases. °SYMPTOMS  °Insect bites usually turn red, swell, and itch for 2 to 4 days. They often go away on their own. °TREATMENT  °Your caregiver may prescribe antibiotic medicines if a bacterial infection develops in the bite. °HOME CARE INSTRUCTIONS °· Do not scratch the bite area. °· Keep the bite area clean and dry. Wash the bite area thoroughly with soap and water. °· Put ice or cool compresses on the bite area. °¨ Put ice in a plastic bag. °¨ Place a towel between your skin and the bag. °¨ Leave the ice on for 20 minutes, 4 times a day for the first 2 to 3 days, or as directed. °· You may apply a baking soda paste, cortisone cream, or calamine lotion to the bite area as directed by your caregiver. This can help reduce itching and swelling. °· Only take over-the-counter or prescription medicines as directed by your caregiver. °· If you are given antibiotics, take them as directed. Finish them even if you start to feel better. °You may need a tetanus shot if: °· You cannot remember when you had your last tetanus shot. °· You have never had a   tetanus shot. °· The injury broke your skin. °If you get a tetanus shot, your arm may swell, get red, and feel warm to the touch. This is common and not a problem. If you need a tetanus shot and you choose not to have one, there is a rare chance of getting tetanus. Sickness from tetanus can be serious. °SEEK IMMEDIATE MEDICAL CARE IF:  °· You have increased pain, redness, or swelling in the bite area. °· You see a red line on the skin coming from the bite. °· You have a fever. °· You have  joint pain. °· You have a headache or neck pain. °· You have unusual weakness. °· You have a rash. °· You have chest pain or shortness of breath. °· You have abdominal pain, nausea, or vomiting. °· You feel unusually tired or sleepy. °MAKE SURE YOU:  °· Understand these instructions. °· Will watch your condition. °· Will get help right away if you are not doing well or get worse. °Document Released: 02/17/2004 Document Revised: 04/03/2011 Document Reviewed: 08/10/2010 °ExitCare® Patient Information ©2015 [employer], LLC. This information is not intended to replace advice given to you by your health care provider. Make sure you discuss any questions you have with your health care provider. ° °

## 2014-08-16 NOTE — ED Notes (Signed)
Pt presents with c/o possible spider bite to her lower back that happened approx one week ago. Pt reports the bite has gotten worse since she initially noticed it. Pt reports no drainage from that area.

## 2014-08-16 NOTE — ED Provider Notes (Signed)
CSN: [id number]     Arrival date & time 08/16/14  1700 History  This chart was scribed for non-physician practitioner, [name], PA-C, working with Purvis Sheffield, MD, by Ronney Lion, ED Scribe. This patient was seen in room WTR9/WTR9 and the patient's care was started at 6:08 PM.    Chief Complaint  Patient presents with  . Insect Bite   The history is provided by the patient. No language interpreter was used.    HPI Comments: Destiny Wu is a 19 y.o. female who presents to the Emergency Department complaining of a constant, burning, severe pain to an area on her lower back that she suspects is a possible spider bite, with onset 1 week ago. Patient states she has seen spiders around her house and noticed one on her leg at one point. She denies drainage from the site. She denies a history of any prior abscesses.   PCP: No PCP Per Patient Blood pressure 117/62, pulse 95, temperature 99.2 F (37.3 C), temperature source Oral, resp. rate 18, last menstrual period 07/17/2014, SpO2 100 %.  The patient denies diaphoresis, fever, headache, weakness (general or focal), confusion, change of vision,  neck pain, dysphagia, aphagia, chest pain, shortness of breath,  back pain, abdominal pains, nausea, vomiting, diarrhea, lower extremity swelling.  Past Medical History  Diagnosis Date  . UTI (urinary tract infection)    Past Surgical History  Procedure Laterality Date  . No past surgeries     Family History  Problem Relation Age of Onset  . Diabetes Maternal Grandmother    History  Substance Use Topics  . Smoking status: Current Every Day Smoker -- 0.50 packs/day for 1 years    Types: Cigarettes  . Smokeless tobacco: Not on file  . Alcohol Use: No   OB History    Gravida Para Term Preterm AB TAB SAB Ectopic Multiple Living   0     Review of Systems A complete 10 system review of systems was obtained and all systems are negative except as noted in the HPI  and PMH.    Allergies  Review of patient's allergies indicates no known allergies.  Home Medications   Prior to Admission medications   Medication Sig Start Date End Date Taking? Authorizing Provider  acetaminophen (TYLENOL) 500 MG tablet Take 1,000-1,500 mg by mouth every 6 (six) hours as needed for mild pain (pain).     Historical Provider, MD  doxycycline (VIBRAMYCIN) 100 MG capsule Take 1 capsule (100 mg total) by mouth 2 (two) times daily. 08/16/14   [name], PA-C  hydrocortisone cream 1 % Apply to affected area 2 times daily as needed for swelling and pain 08/16/14   [name], PA-C  mupirocin cream (BACTROBAN) 2 % Apply 1 application topically 2 (two) times daily. 01/19/14   Hayden Rasmussen, NP   BP 117/62 mmHg  Pulse 95  Temp(Src) 99.2 F (37.3 C) (Oral)  Resp 18  SpO2 100%  LMP 07/17/2014 (Approximate) Physical Exam  Constitutional: She is oriented to person, place, and time. She appears well-developed and well-nourished. No distress.  HENT:  Head: Normocephalic and atraumatic.  Eyes: Conjunctivae and EOM are normal.  Neck: Neck supple. No tracheal deviation present.  Cardiovascular: Normal rate.   Pulmonary/Chest: Effort normal. No respiratory distress.  Musculoskeletal: Normal range of motion.  Neurological: She is alert and oriented to person, place, and time.  Skin: Skin is warm and dry.  Psychiatric: She has a normal mood and affect. Her behavior is normal.  Nursing note and vitals reviewed.   ED Course  Procedures (including critical care time)  DIAGNOSTIC STUDIES: Oxygen Saturation is 100% on RA, normal by my interpretation.    COORDINATION OF CARE: 6:12 PM - At this time, only small pustule present, with cellulitis. Pt will be started on antibiotics and will be using warm compresses. Discussed with pt risk of abscess formation despite antibiotics and that she may need to return in a few days for drainage if abscess were to develop.. At this time  she does not have a drainable abscess.   MDM   Final diagnoses:  Cellulitis of back except buttock  Insect bite   Rx: Doxycyline and hydrocortisone cream  Medications - No data to display  19 y.o.Destiny Wu's evaluation in the Emergency Department is complete. It has been determined that no acute conditions requiring further emergency intervention are present at this time. The patient/guardian have been advised of the diagnosis and plan. We have discussed signs and symptoms that warrant return to the ED, such as changes or worsening in symptoms.  Vital signs are stable at discharge. Filed Vitals:   08/16/14 1823  BP: 117/62  Pulse: 95  Temp:   Resp: 18    Patient/guardian has voiced understanding and agreed to follow-up with the PCP or specialist.  I personally performed the services described in this documentation, which was scribed in my presence. The recorded information has been reviewed and is accurate.   [name], PA-C 08/16/14 1839  Purvis Sheffield, MD 08/17/14 [phone]

## 2014-08-25 ENCOUNTER — Inpatient Hospital Stay (HOSPITAL_COMMUNITY): Payer: Medicaid Other

## 2014-08-25 ENCOUNTER — Inpatient Hospital Stay (HOSPITAL_COMMUNITY)
Admission: AD | Admit: 2014-08-25 | Discharge: 2014-08-25 | Disposition: A | Discharge status: Home / Self Care | Payer: Self-pay | Source: Ambulatory Visit | Attending: Family Medicine | Admitting: Family Medicine

## 2014-08-25 ENCOUNTER — Encounter (HOSPITAL_COMMUNITY): Payer: Self-pay | Admitting: *Deleted

## 2014-08-25 DIAGNOSIS — R109 Unspecified abdominal pain: Secondary | ICD-10-CM | POA: Insufficient documentation

## 2014-08-25 DIAGNOSIS — Z3A01 Less than 8 weeks gestation of pregnancy: Secondary | ICD-10-CM | POA: Insufficient documentation

## 2014-08-25 DIAGNOSIS — O9989 Other specified diseases and conditions complicating pregnancy, childbirth and the puerperium: Secondary | ICD-10-CM | POA: Insufficient documentation

## 2014-08-25 DIAGNOSIS — O99331 Smoking (tobacco) complicating pregnancy, first trimester: Secondary | ICD-10-CM | POA: Insufficient documentation

## 2014-08-25 DIAGNOSIS — O26899 Other specified pregnancy related conditions, unspecified trimester: Secondary | ICD-10-CM

## 2014-08-25 DIAGNOSIS — F17210 Nicotine dependence, cigarettes, uncomplicated: Secondary | ICD-10-CM | POA: Insufficient documentation

## 2014-08-25 LAB — CBC WITH DIFFERENTIAL/PLATELET
Basophils Absolute: 0 10*3/uL (ref 0.0–0.1)
Basophils Relative: 0 % (ref 0–1)
Eosinophils Absolute: 0.1 10*3/uL (ref 0.0–0.7)
Eosinophils Relative: 1 % (ref 0–5)
HCT: 38.1 % (ref 36.0–46.0)
Hemoglobin: 13 g/dL (ref 12.0–15.0)
LYMPHS PCT: 31 % (ref 12–46)
Lymphs Abs: 2.9 10*3/uL (ref 0.7–4.0)
MCH: 31.4 pg (ref 26.0–34.0)
MCHC: 34.1 g/dL (ref 30.0–36.0)
MCV: 92 fL (ref 78.0–100.0)
MONO ABS: 0.8 10*3/uL (ref 0.1–1.0)
MONOS PCT: 9 % (ref 3–12)
NEUTROS ABS: 5.5 10*3/uL (ref 1.7–7.7)
NEUTROS PCT: 59 % (ref 43–77)
Platelets: 280 10*3/uL (ref 150–400)
RBC: 4.14 MIL/uL (ref 3.87–5.11)
RDW: 14.1 % (ref 11.5–15.5)
WBC: 9.2 10*3/uL (ref 4.0–10.5)

## 2014-08-25 LAB — POCT PREGNANCY, URINE: Preg Test, Ur: POSITIVE — AB

## 2014-08-25 LAB — WET PREP, GENITAL
CLUE CELLS WET PREP: NONE SEEN
TRICH WET PREP: NONE SEEN
WBC, Wet Prep HPF POC: NONE SEEN
Yeast Wet Prep HPF POC: NONE SEEN

## 2014-08-25 LAB — URINALYSIS, ROUTINE W REFLEX MICROSCOPIC
BILIRUBIN URINE: NEGATIVE
GLUCOSE, UA: NEGATIVE mg/dL
Hgb urine dipstick: NEGATIVE
Ketones, ur: 15 mg/dL — AB
LEUKOCYTES UA: NEGATIVE
Nitrite: NEGATIVE
Protein, ur: NEGATIVE mg/dL
SPECIFIC GRAVITY, URINE: 1.02 (ref 1.005–1.030)
UROBILINOGEN UA: 0.2 mg/dL (ref 0.0–1.0)
pH: 6.5 (ref 5.0–8.0)

## 2014-08-25 LAB — HCG, QUANTITATIVE, PREGNANCY: HCG, BETA CHAIN, QUANT, S: 607 m[IU]/mL — AB (ref <5)

## 2014-08-25 NOTE — Discharge Instructions (Signed)
First Trimester of Pregnancy The first trimester of pregnancy is from week 1 until the end of week 12 (months 1 through 3). During this time, your baby will begin to develop inside you. At 6-8 weeks, the eyes and face are formed, and the heartbeat can be seen on ultrasound. At the end of 12 weeks, all the baby's organs are formed. Prenatal care is all the medical care you receive before the birth of your baby. Make sure you get good prenatal care and follow all of your doctor's instructions. HOME CARE  Medicines  Take medicine only as told by your doctor. Some medicines are safe and some are not during pregnancy.  Take your prenatal vitamins as told by your doctor.  Take medicine that helps you poop (stool softener) as needed if your doctor says it is okay. Diet  Eat regular, healthy meals.  Your doctor will tell you the amount of weight gain that is right for you.  Avoid raw meat and uncooked cheese.  If you feel sick to your stomach (nauseous) or throw up (vomit):  Eat 4 or 5 small meals a day instead of 3 large meals.  Try eating a few soda crackers.  Drink liquids between meals instead of during meals.  If you have a hard time pooping (constipation):  Eat high-fiber foods like fresh vegetables, fruit, and whole grains.  Drink enough fluids to keep your pee (urine) clear or pale yellow. Activity and Exercise  Exercise only as told by your doctor. Stop exercising if you have cramps or pain in your lower belly (abdomen) or low back.  Try to avoid standing for long periods of time. Move your legs often if you must stand in one place for a long time.  Avoid heavy lifting.  Wear low-heeled shoes. Sit and stand up straight.  You can have sex unless your doctor tells you not to. Relief of Pain or Discomfort  Wear a good support bra if your breasts are sore.  Take warm water baths (sitz baths) to soothe pain or discomfort caused by hemorrhoids. Use hemorrhoid cream if your  doctor says it is okay.  Rest with your legs raised if you have leg cramps or low back pain.  Wear support hose if you have puffy, bulging veins (varicose veins) in your legs. Raise (elevate) your feet for 15 minutes, 3-4 times a day. Limit salt in your diet. Prenatal Care  Schedule your prenatal visits by the twelfth week of pregnancy.  Write down your questions. Take them to your prenatal visits.  Keep all your prenatal visits as told by your doctor. Safety  Wear your seat belt at all times when driving.  Make a list of emergency phone numbers. The list should include numbers for family, friends, the hospital, and police and fire departments. General Tips  Ask your doctor for a referral to a local prenatal class. Begin classes no later than at the start of month 6 of your pregnancy.  Ask for help if you need counseling or help with nutrition. Your doctor can give you advice or tell you where to go for help.  Do not use hot tubs, steam rooms, or saunas.  Do not douche or use tampons or scented sanitary pads.  Do not cross your legs for long periods of time.  Avoid litter boxes and soil used by cats.  Avoid all smoking, herbs, and alcohol. Avoid drugs not approved by your doctor.  Visit your dentist. At home, brush your teeth   with a soft toothbrush. Be gentle when you floss. GET HELP IF:  You are dizzy.  You have mild cramps or pressure in your lower belly.  You have a nagging pain in your belly area.  You continue to feel sick to your stomach, throw up, or have watery poop (diarrhea).  You have a bad smelling fluid coming from your vagina.  You have pain with peeing (urination).  You have increased puffiness (swelling) in your face, hands, legs, or ankles. GET HELP RIGHT AWAY IF:   You have a fever.  You are leaking fluid from your vagina.  You have spotting or bleeding from your vagina.  You have very bad belly cramping or pain.  You gain or lose weight  rapidly.  You throw up blood. It may look like coffee grounds.  You are around people who have German measles, fifth disease, or chickenpox.  You have a very bad headache.  You have shortness of breath.  You have any kind of trauma, such as from a fall or a car accident. Document Released: 06/28/2007 Document Revised: 05/26/2013 Document Reviewed: 11/19/2012 [employer] Patient Information 2015 [employer]. This information is not intended to replace advice given to you by your health care provider. Make sure you discuss any questions you have with your health care provider.  

## 2014-08-25 NOTE — MAU Note (Signed)
Pt reports she had a positive home preg test, cramping at times.

## 2014-08-25 NOTE — MAU Provider Note (Signed)
History     CSN: [id number]  Arrival date and time: 08/25/14 7829   First Provider Initiated Contact with Patient 08/25/14 2208      No chief complaint on file.  HPI  [name] is a 19 y.o. G2P0100 at [redacted]w[redacted]d who presents to MAU today with complaint of lower abdominal cramping. The patient states that cramping is very mild, similar to "before your period starts." She states LMP 07/17/14 and +HPT recently. She denies vaginal bleeding, discharge, fever, vomiting, diarrhea or UTI symptoms. She has had some nausea and occasional headache. The patient had IUFD at [redacted]w[redacted]d with previous pregnancy. Per previous notes in Epic patient had limited prenatal care and missed screening tests for DM.   Pathology of placenta:  IMMATURE PLACENTA, 117 GRAMS, WITH MULTIPLE INFARCTS, MECONIUM, MILD ACUTE CHORIOAMNIONITIS, HEMORRHAGIC ENDOVASCULITIS AND THREE VESSEL UMBILICAL CORD.     OB History    Gravida Para Term Preterm AB TAB SAB Ectopic Multiple Living   0      Past Medical History  Diagnosis Date  . UTI (urinary tract infection)     Past Surgical History  Procedure Laterality Date  . No past surgeries      Family History  Problem Relation Age of Onset  . Diabetes Maternal Grandmother     History  Substance Use Topics  . Smoking status: Current Every Day Smoker -- 0.50 packs/day for 1 years    Types: Cigarettes  . Smokeless tobacco: Not on file  . Alcohol Use: No    Allergies: No Known Allergies  No prescriptions prior to admission    Review of Systems  Constitutional: Negative for fever and malaise/fatigue.  Gastrointestinal: Positive for nausea and abdominal pain. Negative for vomiting, diarrhea and constipation.  Genitourinary: Negative for dysuria, urgency and frequency.       Neg -vaginal bleeding, discharge  Neurological: Positive for headaches.   Physical Exam   Blood pressure 103/61, pulse 65, temperature 98 F (36.7 C), temperature  source Oral, resp. rate 18, height  (1.626 m), weight 198 lb 4 oz (89.926 kg), last menstrual period 07/17/2014, SpO2 100 %.  Physical Exam  Nursing note and vitals reviewed. Constitutional: She is oriented to person, place, and time. She appears well-developed and well-nourished. No distress.  HENT:  Head: Normocephalic and atraumatic.  Cardiovascular: Normal rate.   Respiratory: Effort normal.  GI: Soft. She exhibits no distension and no mass. There is no tenderness. There is no rebound and no guarding.  Neurological: She is alert and oriented to person, place, and time.  Skin: Skin is warm and dry. No erythema.  Psychiatric: She has a normal mood and affect.   Results for orders placed or performed during the hospital encounter of 08/25/14 (from the past 24 hour(s))  Urinalysis, Routine w reflex microscopic (not at [facility])     Status: Abnormal   Collection Time: 08/25/14  7:48 PM  Result Value Ref Range   Color, Urine YELLOW YELLOW   APPearance CLEAR CLEAR   Specific Gravity, Urine 1.020 1.005 - 1.030   pH 6.5 5.0 - 8.0   Glucose, UA NEGATIVE NEGATIVE mg/dL   Hgb urine dipstick NEGATIVE NEGATIVE   Bilirubin Urine NEGATIVE NEGATIVE   Ketones, ur 15 (A) NEGATIVE mg/dL   Protein, ur NEGATIVE NEGATIVE mg/dL   Urobilinogen, UA 0.2 0.0 - 1.0 mg/dL   Nitrite NEGATIVE NEGATIVE   Leukocytes, UA NEGATIVE NEGATIVE  Pregnancy, urine POC  Status: Abnormal   Collection Time: 08/25/14  7:56 PM  Result Value Ref Range   Preg Test, Ur POSITIVE (A) NEGATIVE  CBC with Differential/Platelet     Status: None   Collection Time: 08/25/14  8:41 PM  Result Value Ref Range   WBC 9.2 4.0 - 10.5 K/uL   RBC 4.14 3.87 - 5.11 MIL/uL   Hemoglobin 13.0 12.0 - 15.0 g/dL   HCT 16.1 09.6 - 04.5 %   MCV 92.0 78.0 - 100.0 fL   MCH 31.4 26.0 - 34.0 pg   MCHC 34.1 30.0 - 36.0 g/dL   RDW 40.9 81.1 - 91.4 %   Platelets 280 150 - 400 K/uL   Neutrophils Relative % 59 43 - 77 %   Neutro Abs 5.5 1.7 - 7.7  K/uL   Lymphocytes Relative 31 12 - 46 %   Lymphs Abs 2.9 0.7 - 4.0 K/uL   Monocytes Relative 9 3 - 12 %   Monocytes Absolute 0.8 0.1 - 1.0 K/uL   Eosinophils Relative 1 0 - 5 %   Eosinophils Absolute 0.1 0.0 - 0.7 K/uL   Basophils Relative 0 0 - 1 %   Basophils Absolute 0.0 0.0 - 0.1 K/uL  hCG, quantitative, pregnancy     Status: Abnormal   Collection Time: 08/25/14  8:41 PM  Result Value Ref Range   hCG, Beta Chain, Quant, S 607 (H) <5 mIU/mL  Wet prep, genital     Status: None   Collection Time: 08/25/14 10:06 PM  Result Value Ref Range   Yeast Wet Prep HPF POC NONE SEEN NONE SEEN   Trich, Wet Prep NONE SEEN NONE SEEN   Clue Cells Wet Prep HPF POC NONE SEEN NONE SEEN   WBC, Wet Prep HPF POC NONE SEEN NONE SEEN    US Ob Comp Less 14 Wks  08/25/2014   CLINICAL DATA:  Acute onset of pelvic cramping for 1 week. Initial encounter.  EXAM: OBSTETRIC <14 WK [place] AND TRANSVAGINAL OB US  TECHNIQUE: Both transabdominal and transvaginal ultrasound examinations were performed for complete evaluation of the gestation as well as the maternal uterus, adnexal regions, and pelvic cul-de-sac. Transvaginal technique was performed to assess early pregnancy.  COMPARISON:  Pelvic ultrasound performed 06/20/2012  FINDINGS: Intrauterine gestational sac: None seen.  Yolk sac:  N/A  Embryo:  N/A  Maternal uterus/adnexae: The uterus is unremarkable in appearance.  The ovaries are unremarkable in appearance. The right ovary measures 2.3 x 1.8 x 2.1 cm, while the left ovary measures 3.6 x 2.8 x 2.1 cm. No suspicious adnexal masses are seen; there is no evidence for ovarian torsion.  No free fluid is seen within the pelvic cul-de-sac.  IMPRESSION: No intrauterine gestational sac seen at this time. No evidence for ectopic pregnancy. This remains within normal limits, given the quantitative beta HCG level of 607. If the patient's quantitative beta HCG level continues to trend upward, follow-up pelvic ultrasound could be  considered in 2 weeks for further evaluation.   Electronically Signed   By: Roanna Raider M.D.   On: 08/25/2014 21:40   US Ob Transvaginal  08/25/2014   CLINICAL DATA:  Acute onset of pelvic cramping for 1 week. Initial encounter.  EXAM: OBSTETRIC <14 WK [place] AND TRANSVAGINAL OB US  TECHNIQUE: Both transabdominal and transvaginal ultrasound examinations were performed for complete evaluation of the gestation as well as the maternal uterus, adnexal regions, and pelvic cul-de-sac. Transvaginal technique was performed to assess early pregnancy.  COMPARISON:  Pelvic ultrasound performed 06/20/2012  FINDINGS: Intrauterine gestational sac: None seen.  Yolk sac:  N/A  Embryo:  N/A  Maternal uterus/adnexae: The uterus is unremarkable in appearance.  The ovaries are unremarkable in appearance. The right ovary measures 2.3 x 1.8 x 2.1 cm, while the left ovary measures 3.6 x 2.8 x 2.1 cm. No suspicious adnexal masses are seen; there is no evidence for ovarian torsion.  No free fluid is seen within the pelvic cul-de-sac.  IMPRESSION: No intrauterine gestational sac seen at this time. No evidence for ectopic pregnancy. This remains within normal limits, given the quantitative beta HCG level of 607. If the patient's quantitative beta HCG level continues to trend upward, follow-up pelvic ultrasound could be considered in 2 weeks for further evaluation.   Electronically Signed   By: Roanna Raider M.D.   On: 08/25/2014 21:40     MAU Course  Procedures None  MDM +UPT UA, wet prep, GC/chlamydia, CBC, quant hCG, HIV, RPR and [place] today to rule out ectopic pregnancy Patient has history of IUFD at 34weeks. Refer to [facility] at [facility] Patient agrees with plan.  Assessment and Plan  A: Abdominal pain in pregnancy Pregnancy of unknown location  P: Discharge home Tylenol advised PRN for pain Ectopic precautions discussed Patient advised to follow-up in MAU in 48 hours for repeat labs or sooner if symptoms worsen  Marny Lowenstein, PA-C  08/25/2014, 11:58 PM

## 2014-08-26 LAB — GC/CHLAMYDIA PROBE AMP (~~LOC~~) NOT AT ARMC
Chlamydia: NEGATIVE
NEISSERIA GONORRHEA: NEGATIVE

## 2014-08-26 LAB — HIV ANTIBODY (ROUTINE TESTING W REFLEX): HIV Screen 4th Generation wRfx: NONREACTIVE

## 2014-08-26 LAB — RPR: RPR Ser Ql: NONREACTIVE

## 2014-08-27 ENCOUNTER — Inpatient Hospital Stay (HOSPITAL_COMMUNITY)
Admission: AD | Admit: 2014-08-27 | Discharge: 2014-08-27 | Disposition: A | Discharge status: Home / Self Care | Payer: Medicaid Other | Source: Ambulatory Visit | Attending: Family Medicine | Admitting: Family Medicine

## 2014-08-27 DIAGNOSIS — O9989 Other specified diseases and conditions complicating pregnancy, childbirth and the puerperium: Secondary | ICD-10-CM

## 2014-08-27 DIAGNOSIS — O0281 Inappropriate change in quantitative human chorionic gonadotropin (hCG) in early pregnancy: Secondary | ICD-10-CM | POA: Insufficient documentation

## 2014-08-27 DIAGNOSIS — O99331 Smoking (tobacco) complicating pregnancy, first trimester: Secondary | ICD-10-CM | POA: Insufficient documentation

## 2014-08-27 DIAGNOSIS — Z3A01 Less than 8 weeks gestation of pregnancy: Secondary | ICD-10-CM | POA: Insufficient documentation

## 2014-08-27 DIAGNOSIS — R102 Pelvic and perineal pain: Secondary | ICD-10-CM

## 2014-08-27 DIAGNOSIS — O26899 Other specified pregnancy related conditions, unspecified trimester: Secondary | ICD-10-CM

## 2014-08-27 DIAGNOSIS — O3680X0 Pregnancy with inconclusive fetal viability, not applicable or unspecified: Secondary | ICD-10-CM

## 2014-08-27 DIAGNOSIS — F17210 Nicotine dependence, cigarettes, uncomplicated: Secondary | ICD-10-CM | POA: Insufficient documentation

## 2014-08-27 LAB — HCG, QUANTITATIVE, PREGNANCY: hCG, Beta Chain, Quant, S: 681 m[IU]/mL — ABNORMAL HIGH (ref <5)

## 2014-08-27 NOTE — MAU Provider Note (Signed)
History     CSN: [id number]  Arrival date and time: 08/27/14 2151   First Provider Initiated Contact with Patient 08/27/14 2301      No chief complaint on file.  HPI Comments: Destiny Wu is a 19 y.o. G2P0100 at [redacted]w[redacted]d who presents today for FU HCG. She states that the pain she had two day ago has resolved. She denies any vaginal bleeding at this time.   Abdominal Pain This is a new problem. The current episode started in the past 7 days. The onset quality is gradual. The problem has been resolved. The pain is at a severity of 0/10. The patient is experiencing no pain. The abdominal pain does not radiate. Pertinent negatives include no constipation, diarrhea, dysuria, fever, frequency, nausea or vomiting. Nothing aggravates the pain. The pain is relieved by nothing. She has tried nothing for the symptoms.     Past Medical History  Diagnosis Date  . UTI (urinary tract infection)     Past Surgical History  Procedure Laterality Date  . No past surgeries      Family History  Problem Relation Age of Onset  . Diabetes Maternal Grandmother     History  Substance Use Topics  . Smoking status: Current Every Day Smoker -- 0.50 packs/day for 1 years    Types: Cigarettes  . Smokeless tobacco: Not on file  . Alcohol Use: No    Allergies: No Known Allergies  Prescriptions prior to admission  Medication Sig Dispense Refill Last Dose  . acetaminophen (TYLENOL) 500 MG tablet Take 1,000-1,500 mg by mouth every 6 (six) hours as needed for mild pain (pain).    More than a month at Unknown time  . hydrocortisone cream 1 % Apply to affected area 2 times daily as needed for swelling and pain 15 g 0 08/24/2014 at Unknown time    Review of Systems  Constitutional: Negative for fever.  Gastrointestinal: Negative for nausea, vomiting, abdominal pain, diarrhea and constipation.  Genitourinary: Negative for dysuria, urgency and frequency.   Physical Exam   Blood pressure 112/57,  pulse 68, temperature 98.8 F (37.1 C), temperature source Oral, resp. rate 20, height  (1.626 m), weight 88.451 kg (195 lb), last menstrual period 07/17/2014, SpO2 100 %.  Physical Exam  Nursing note and vitals reviewed. Constitutional: She is oriented to person, place, and time. She appears well-developed and well-nourished. No distress.  HENT:  Head: Normocephalic.  Cardiovascular: Normal rate.   Respiratory: Effort normal.  GI: Soft. There is no tenderness. There is no rebound.  Neurological: She is alert and oriented to person, place, and time.  Skin: Skin is warm and dry.  Psychiatric: She has a normal mood and affect.   Results for Destiny, Wu (MRN [id number]) as of 08/27/2014 23:01  Ref. Range 08/25/2014 20:41 08/25/2014 21:37 08/25/2014 22:06 08/27/2014 22:10  HCG, Beta Chain, Quant, S Latest Ref Range: <5 mIU/mL 607 (H)   681 (H)   MAU Course  Procedures  MDM 2300: D/W Dr. Adrian Blackwater, will repeat HCG in 48 hours   Assessment and Plan   1. Pelvic pain affecting pregnancy   2. Pregnancy of unknown anatomic location   3. Inappropriate change in quantitative hCG in early pregnancy    DC home Comfort measures reviewed  1st Trimester precautions  Bleeding precautions Ectopic precautions RX: none  Return to MAU in 48 hours   Follow-up Information    Follow up with [facility] [place] [facility].  Why:  Saturday 08/29/14    Contact information:   [address] [id number] mc [place] [address] [phone]        Destiny Wu 08/27/2014, 11:02 PM

## 2014-08-27 NOTE — MAU Note (Signed)
Pt returns for repeat BHCG. Denies bleeding or pain.

## 2014-08-27 NOTE — Discharge Instructions (Signed)
°Ectopic Pregnancy °An ectopic pregnancy is when the fertilized egg attaches (implants) outside the uterus. Most ectopic pregnancies occur in the fallopian tube. Rarely do ectopic pregnancies occur on the ovary, intestine, pelvis, or cervix. In an ectopic pregnancy, the fertilized egg does not have the ability to develop into a normal, healthy baby.  °A ruptured ectopic pregnancy is one in which the fallopian tube gets torn or bursts and results in internal bleeding. Often there is intense abdominal pain, and sometimes, vaginal bleeding. Having an ectopic pregnancy can be life threatening. If left untreated, this dangerous condition can lead to a blood transfusion, abdominal surgery, or even death. °CAUSES  °Damage to the fallopian tubes is the suspected cause in most ectopic pregnancies.  °RISK FACTORS °Depending on your circumstances, the risk of having an ectopic pregnancy will vary. The level of risk can be divided into three categories. °High Risk °· You have gone through infertility treatment. °· You have had a previous ectopic pregnancy. °· You have had previous tubal surgery. °· You have had previous surgery to have the fallopian tubes tied (tubal ligation). °· You have tubal problems or diseases. °· You have been exposed to DES. DES is a medicine that was used until 1971 and had effects on babies whose mothers took the medicine. °· You become pregnant while using an intrauterine device (IUD) for birth control.  °Moderate Risk °· You have a history of infertility. °· You have a history of a sexually transmitted infection (STI). °· You have a history of pelvic inflammatory disease (PID). °· You have scarring from endometriosis. °· You have multiple sexual partners. °· You smoke.  °Low Risk °· You have had previous pelvic surgery. °· You use vaginal douching. °· You became sexually active before 21 years of age. °SIGNS AND SYMPTOMS  °An ectopic pregnancy should be suspected in anyone who has missed a period  and has abdominal pain or bleeding. °· You may experience normal pregnancy symptoms, such as: °¨ Nausea. °¨ Tiredness. °¨ Breast tenderness. °· Other symptoms may include: °¨ Pain with intercourse. °¨ Irregular vaginal bleeding or spotting. °¨ Cramping or pain on one side or in the lower abdomen. °¨ Fast heartbeat. °¨ Passing out while having a bowel movement. °· Symptoms of a ruptured ectopic pregnancy and internal bleeding may include: °¨ Sudden, severe pain in the abdomen and pelvis. °¨ Dizziness or fainting. °¨ Pain in the shoulder area. °DIAGNOSIS  °Tests that may be performed include: °· A pregnancy test. °· An ultrasound test. °· Testing the specific level of pregnancy hormone in the bloodstream. °· Taking a sample of uterus tissue (dilation and curettage, D&C). °· Surgery to perform a visual exam of the inside of the abdomen using a thin, lighted tube with a tiny camera on the end (laparoscope). °TREATMENT  °An injection of a medicine called methotrexate may be given. This medicine causes the pregnancy tissue to be absorbed. It is given if: °· The diagnosis is made early. °· The fallopian tube has not ruptured. °· You are considered to be a good candidate for the medicine. °Usually, pregnancy hormone blood levels are checked after methotrexate treatment. This is to be sure the medicine is effective. It may take 4-6 weeks for the pregnancy to be absorbed (though most pregnancies will be absorbed by 3 weeks). °Surgical treatment may be needed. A laparoscope may be used to remove the pregnancy tissue. If severe internal bleeding occurs, a cut (incision) may be made in the lower abdomen (laparotomy), and the ectopic   pregnancy is removed. This stops the bleeding. Part of the fallopian tube, or the whole tube, may be removed as well (salpingectomy). After surgery, pregnancy hormone tests may be done to be sure there is no pregnancy tissue left. You may receive a Rho (D) immune globulin shot if you are Rh negative  and the father is Rh positive, or if you do not know the Rh type of the father. This is to prevent problems with any future pregnancy. °SEEK IMMEDIATE MEDICAL CARE IF:  °You have any symptoms of an ectopic pregnancy. This is a medical emergency. °MAKE SURE YOU: °· Understand these instructions. °· Will watch your condition. °· Will get help right away if you are not doing well or get worse. °Document Released: 02/17/2004 Document Revised: 05/26/2013 Document Reviewed: 08/08/2012 °Exit[employer]® Patient Information ©2015 [employer]. This information is not intended to replace advice given to you by your health care provider. Make sure you discuss any questions you have with your health care provider. ° ° °

## 2014-08-29 ENCOUNTER — Inpatient Hospital Stay (HOSPITAL_COMMUNITY)
Admission: AD | Admit: 2014-08-29 | Discharge: 2014-08-30 | Disposition: A | Discharge status: Home / Self Care | Payer: Self-pay | Source: Ambulatory Visit | Attending: Obstetrics & Gynecology | Admitting: Obstetrics & Gynecology

## 2014-08-29 DIAGNOSIS — O0281 Inappropriate change in quantitative human chorionic gonadotropin (hCG) in early pregnancy: Secondary | ICD-10-CM | POA: Insufficient documentation

## 2014-08-29 DIAGNOSIS — O3680X0 Pregnancy with inconclusive fetal viability, not applicable or unspecified: Secondary | ICD-10-CM

## 2014-08-29 LAB — HCG, QUANTITATIVE, PREGNANCY: hCG, Beta Chain, Quant, S: 718 m[IU]/mL — ABNORMAL HIGH (ref <5)

## 2014-08-29 NOTE — MAU Note (Signed)
Here for repeat BHCG. Denies any pain or bleeding and no complaints.

## 2014-08-30 NOTE — MAU Provider Note (Signed)
Subjective:  [name] is a 19 y.o. female G2P0100 at [redacted]w[redacted]d presenting to MAU for a follow up beta hcg level. She was originally seen on 8/2 with lower abdominal cramping after a positive home pregnancy test. The patient has a history of an IUFD and is very nervous about this pregnancy and this pregnancy is highly desired.   The patient has not pain and no bleeding at this time.   Objective:   GENERAL: Well-developed, well-nourished female in no acute distress.  LUNGS: Effort normal SKIN: Warm, dry and without erythema PSYCH: Normal mood and affect  Filed Vitals:   08/29/14 2220  Pulse: 92  Temp: 98.5 F (36.9 C)  Resp: 18   MDM:  Beta hcg level 8/2: 607 Beta hcg level 8/4: 681 Beta hcg level 8/6: 718  Discussed labs with Dr. Debroah Loop. [place] on 8/2 shows no IUP; no ectopic. The patient is clinically stable.   The patient and significant other had a lot of questions regarding the abnormal rise in [place]. The patient currently has no pain and no bleeding. They would like to have a few more days to process the information and make a decision if presented to them regarding MTX. I discussed and highly emphasized that based on the beta hcg levels this is not a healthy growing pregnancy. I prepared them for the possibility of MTX administration on Monday night if the beta levels remain unchanged.    Assessment:  1. Pregnancy of unknown anatomic location   2. Inappropriate change in quantitative hCG in early pregnancy    P:  Discharge home in stable condition Strict ectopic precautions Return to MAU with pain or bleeding  Pelvic rest Return Monday night around 2300 for repeat beta hcg level (patient would like this time due to schedule)  Duane Lope, NP 08/30/2014 12:08 AM

## 2014-08-30 NOTE — MAU Note (Signed)
Venia Carbon NP talked with pt regarding lab results and d/c plan made. Pt d/c home from Triage

## 2014-08-31 ENCOUNTER — Encounter (HOSPITAL_COMMUNITY): Payer: Self-pay | Admitting: *Deleted

## 2014-08-31 ENCOUNTER — Inpatient Hospital Stay (HOSPITAL_COMMUNITY)
Admission: AD | Admit: 2014-08-31 | Discharge: 2014-08-31 | Disposition: A | Discharge status: Home / Self Care | Payer: Self-pay | Source: Ambulatory Visit | Attending: Family Medicine | Admitting: Family Medicine

## 2014-08-31 DIAGNOSIS — O208 Other hemorrhage in early pregnancy: Secondary | ICD-10-CM

## 2014-08-31 DIAGNOSIS — Z3A01 Less than 8 weeks gestation of pregnancy: Secondary | ICD-10-CM | POA: Insufficient documentation

## 2014-08-31 DIAGNOSIS — R109 Unspecified abdominal pain: Secondary | ICD-10-CM | POA: Insufficient documentation

## 2014-08-31 DIAGNOSIS — O9989 Other specified diseases and conditions complicating pregnancy, childbirth and the puerperium: Secondary | ICD-10-CM | POA: Insufficient documentation

## 2014-08-31 LAB — HCG, QUANTITATIVE, PREGNANCY: hCG, Beta Chain, Quant, S: 719 m[IU]/mL — ABNORMAL HIGH (ref <5)

## 2014-08-31 LAB — CBC
HCT: 39.6 % (ref 36.0–46.0)
Hemoglobin: 13.5 g/dL (ref 12.0–15.0)
MCH: 31.4 pg (ref 26.0–34.0)
MCHC: 34.1 g/dL (ref 30.0–36.0)
MCV: 92.1 fL (ref 78.0–100.0)
PLATELETS: 282 10*3/uL (ref 150–400)
RBC: 4.3 MIL/uL (ref 3.87–5.11)
RDW: 14 % (ref 11.5–15.5)
WBC: 8.2 10*3/uL (ref 4.0–10.5)

## 2014-08-31 LAB — URINE MICROSCOPIC-ADD ON

## 2014-08-31 LAB — URINALYSIS, ROUTINE W REFLEX MICROSCOPIC
BILIRUBIN URINE: NEGATIVE
GLUCOSE, UA: NEGATIVE mg/dL
KETONES UR: 15 mg/dL — AB
Leukocytes, UA: NEGATIVE
Nitrite: NEGATIVE
PROTEIN: NEGATIVE mg/dL
Specific Gravity, Urine: >1.03 — ABNORMAL HIGH (ref 1.005–1.030)
UROBILINOGEN UA: 1 mg/dL (ref 0.0–1.0)
pH: 6 (ref 5.0–8.0)

## 2014-08-31 NOTE — MAU Provider Note (Signed)
  History   G2P1 at 6.3 wks in with c/o and cramping and bleeding since last night. Having sm amt bleeding and this evwening has passed several sm dime sized clots.  CSN: [id number]  Arrival date and time: 08/31/14 1848   None     No chief complaint on file.  HPI  OB History    Gravida Para Term Preterm AB TAB SAB Ectopic Multiple Living   0      Past Medical History  Diagnosis Date  . UTI (urinary tract infection)     Past Surgical History  Procedure Laterality Date  . No past surgeries      Family History  Problem Relation Age of Onset  . Diabetes Maternal Grandmother     History  Substance Use Topics  . Smoking status: Current Every Day Smoker -- 0.50 packs/day for 1 years    Types: Cigarettes  . Smokeless tobacco: Not on file  . Alcohol Use: No    Allergies: No Known Allergies  Prescriptions prior to admission  Medication Sig Dispense Refill Last Dose  . acetaminophen (TYLENOL) 500 MG tablet Take 1,000-1,500 mg by mouth every 6 (six) hours as needed for mild pain (pain).    More than a month at Unknown time  . hydrocortisone cream 1 % Apply to affected area 2 times daily as needed for swelling and pain 15 g 0 08/24/2014 at Unknown time    Review of Systems  Constitutional: Negative.   HENT: Negative.   Eyes: Negative.   Respiratory: Negative.   Cardiovascular: Negative.   Gastrointestinal: Positive for abdominal pain.  Genitourinary: Negative.   Musculoskeletal: Negative.   Skin: Negative.   Neurological: Negative.   Endo/Heme/Allergies: Negative.   Psychiatric/Behavioral: Negative.    Physical Exam   Blood pressure 112/66, pulse 72, temperature 99.3 F (37.4 C), temperature source Oral, resp. rate 15, last menstrual period 07/17/2014, SpO2 99 %.  Physical Exam  Constitutional: She is oriented to person, place, and time. She appears well-developed and well-nourished.  HENT:  Head: Normocephalic.  Eyes: Pupils are equal, round, and  reactive to light.  Cardiovascular: Normal rate, regular rhythm, normal heart sounds and intact distal pulses.   Respiratory: Effort normal and breath sounds normal.  GI: Soft. Bowel sounds are normal. She exhibits no distension. There is no tenderness. There is no rebound and no guarding.  Genitourinary:  sm amt bright vag bleeding  Musculoskeletal: Normal range of motion.  Neurological: She is alert and oriented to person, place, and time. She has normal reflexes.  Skin: Skin is warm and dry.  Psychiatric: She has a normal mood and affect. Her behavior is normal. Judgment and thought content normal.    MAU Course  Procedures  MDM Quant hcg cbc  Assessment and Plan  Quant, cbc. Quant 719 no change from 08/29/14. Discussed findings with pt and sig other and both wish to take a wait and see attitude. She has appointment in clinisc on 09/10/14 and will return if symptoms get worse.  Wyvonnia Dusky DARLENE 08/31/2014, 7:32 PM

## 2014-08-31 NOTE — Discharge Instructions (Signed)

## 2014-08-31 NOTE — MAU Note (Signed)
Pt reports she is here for repeat BHCG but she started having spotting yesterday and today is like a period. Also reports increased cramping today.

## 2014-09-08 ENCOUNTER — Inpatient Hospital Stay (HOSPITAL_COMMUNITY)
Admission: AD | Admit: 2014-09-08 | Discharge: 2014-09-08 | Disposition: A | Discharge status: Home / Self Care | Payer: Self-pay | Source: Ambulatory Visit | Attending: Obstetrics & Gynecology | Admitting: Obstetrics & Gynecology

## 2014-09-08 ENCOUNTER — Ambulatory Visit (INDEPENDENT_AMBULATORY_CARE_PROVIDER_SITE_OTHER): Payer: Self-pay | Admitting: Obstetrics & Gynecology

## 2014-09-08 ENCOUNTER — Encounter: Payer: Self-pay | Admitting: Obstetrics & Gynecology

## 2014-09-08 DIAGNOSIS — O200 Threatened abortion: Secondary | ICD-10-CM | POA: Insufficient documentation

## 2014-09-08 DIAGNOSIS — O26899 Other specified pregnancy related conditions, unspecified trimester: Secondary | ICD-10-CM

## 2014-09-08 DIAGNOSIS — O359XX0 Maternal care for (suspected) fetal abnormality and damage, unspecified, not applicable or unspecified: Secondary | ICD-10-CM

## 2014-09-08 DIAGNOSIS — O039 Complete or unspecified spontaneous abortion without complication: Secondary | ICD-10-CM

## 2014-09-08 DIAGNOSIS — O3680X0 Pregnancy with inconclusive fetal viability, not applicable or unspecified: Secondary | ICD-10-CM

## 2014-09-08 DIAGNOSIS — Z3A01 Less than 8 weeks gestation of pregnancy: Secondary | ICD-10-CM | POA: Insufficient documentation

## 2014-09-08 DIAGNOSIS — R109 Unspecified abdominal pain: Secondary | ICD-10-CM

## 2014-09-08 LAB — POCT URINALYSIS DIP (DEVICE)
Bilirubin Urine: NEGATIVE
Glucose, UA: NEGATIVE mg/dL
Ketones, ur: NEGATIVE mg/dL
Leukocytes, UA: NEGATIVE
NITRITE: NEGATIVE
PH: 6 (ref 5.0–8.0)
PROTEIN: NEGATIVE mg/dL
SPECIFIC GRAVITY, URINE: 1.025 (ref 1.005–1.030)
UROBILINOGEN UA: 0.2 mg/dL (ref 0.0–1.0)

## 2014-09-08 LAB — HCG, QUANTITATIVE, PREGNANCY: hCG, Beta Chain, Quant, S: 9 m[IU]/mL — ABNORMAL HIGH (ref <5)

## 2014-09-08 NOTE — MAU Provider Note (Signed)
Subjective:   Pt is a 19 y.o. G2P0100 here for follow-up BHCG.     BHCG on that day was   Results were 719.   Last seen in MAU on 08/31/14 Today repeat BHCG is 9.  Pt denies any bleeding or pain. No follow up needed Results for orders placed or performed during the hospital encounter of 09/08/14 (from the past 24 hour(s))  hCG, quantitative, pregnancy     Status: Abnormal   Collection Time: 09/08/14  2:04 PM  Result Value Ref Range   hCG, Beta Chain, Quant, S 9 (H) <5 mIU/mL    Objective: Physical Exam  There were no vitals filed for this visit. Constitutional: She is oriented to person, place, and time. She appears well-developed and well-nourished. No distress.  Neck: Normal range of motion.  Pulmonary/Chest: Effort normal. No respiratory distress.  Musculoskeletal: Normal range of motion.  Neurological: She is alert and oriented to person, place, and time.  Skin: Skin is warm and dry.    Assessment: 19 y.o. G2P0100 at [redacted]w[redacted]d wks Pregnancy with probable SAB given falling BHCG's   Plan: Discharge to home; no follow up needed

## 2014-09-08 NOTE — Progress Notes (Signed)
   CLINIC ENCOUNTER NOTE  19 y.o. G2P0100 at [redacted]w[redacted]d by LMP here today for initial prenatal visit, but with unconfirmed pregnancy viability. Has been seen in [facility] for the past two weeks for bleeding and cramping:  Results for Destiny Wu, Destiny Wu (MRN [id number]) as of 09/08/2014 13:54  Ref. Range 08/25/2014 22:06 08/27/2014 22:10 08/29/2014 22:25 08/31/2014 19:35  HCG, Beta Chain, Quant, S Latest Ref Range: <5 mIU/mL 607 (H) 681 (H) 718 (H) 719 (H)    08/25/2014   OBSTETRIC <14 WK [place] AND TRANSVAGINAL OB US  CLINICAL DATA:  Acute onset of pelvic cramping for 1 week. Initial encounter.    TECHNIQUE: Both transabdominal and transvaginal ultrasound examinations were performed for complete evaluation of the gestation as well as the maternal uterus, adnexal regions, and pelvic cul-de-sac. Transvaginal technique was performed to assess early pregnancy.  COMPARISON:  Pelvic ultrasound performed 06/20/2012  FINDINGS: Intrauterine gestational sac: None seen.  Yolk sac:  N/A  Embryo:  N/A  Maternal uterus/adnexae: The uterus is unremarkable in appearance.  The ovaries are unremarkable in appearance. The right ovary measures 2.3 x 1.8 x 2.1 cm, while the left ovary measures 3.6 x 2.8 x 2.1 cm. No suspicious adnexal masses are seen; there is no evidence for ovarian torsion.  No free fluid is seen within the pelvic cul-de-sac.  IMPRESSION: No intrauterine gestational sac seen at this time. No evidence for ectopic pregnancy. This remains within normal limits, given the quantitative beta HCG level of 607. If the patient's quantitative beta HCG level continues to trend upward, follow-up pelvic ultrasound could be considered in 2 weeks for further evaluation.   Electronically Signed   By: Roanna Raider M.D.   On: 08/25/2014 21:40   She reports continued spotting and occasional cramping.  Past Medical History  Diagnosis Date  . UTI (urinary tract infection)     Past Surgical History  Procedure Laterality Date  . No past  surgeries     OB History  Gravida Para Term Preterm AB SAB TAB Ectopic Multiple Living  0    # Outcome Date GA Lbr Len/2nd Weight Sex Delivery Anes PTL Lv  2 Current           1 Preterm 06/20/12 [redacted]w[redacted]d 03:00 / 00:06 2 lb 11 oz (1.219 kg) F Vag-Spont None  FD     The following portions of the patient's history were reviewed and updated as appropriate: allergies, current medications, past family history, past medical history, past social history, past surgical history and problem list.   Review of Systems:  Pertinent items are noted in HPI. Comprehensive review of systems was otherwise negative.  Objective:  Physical Exam deferred LMP 07/17/2014 (Approximate)   Assessment & Plan:  Given uncertain viability of pregnancy and concern about plateauing HCG values which may indicate ectopic pregnancy; patient will be sent to [facility]U for repeat HCG and ultrasound. MAU RN and NP notified; they will manage accordingly If pregnancy is viable, patient will need to initiate prenatal care here given history of 35 week IUFD   Jaynie Collins, MD, FACOG Attending Obstetrician & Gynecologist, [facility]

## 2014-09-08 NOTE — Discharge Instructions (Signed)
Miscarriage A miscarriage is the sudden loss of an unborn baby (fetus) before the 20th week of pregnancy. Most miscarriages happen in the first 3 months of pregnancy. Sometimes, it happens before a woman even knows she is pregnant. A miscarriage is also called a "spontaneous miscarriage" or "early pregnancy loss." Having a miscarriage can be an emotional experience. Talk with your caregiver about any questions you may have about miscarrying, the grieving process, and your future pregnancy plans. CAUSES   Problems with the fetal chromosomes that make it impossible for the baby to develop normally. Problems with the baby's genes or chromosomes are most often the result of errors that occur, by chance, as the embryo divides and grows. The problems are not inherited from the parents.  Infection of the cervix or uterus.   Hormone problems.   Problems with the cervix, such as having an incompetent cervix. This is when the tissue in the cervix is not strong enough to hold the pregnancy.   Problems with the uterus, such as an abnormally shaped uterus, uterine fibroids, or congenital abnormalities.   Certain medical conditions.   Smoking, drinking alcohol, or taking illegal drugs.   Trauma.  Often, the cause of a miscarriage is unknown.  SYMPTOMS   Vaginal bleeding or spotting, with or without cramps or pain.  Pain or cramping in the abdomen or lower back.  Passing fluid, tissue, or blood clots from the vagina. DIAGNOSIS  Your caregiver will perform a physical exam. You may also have an ultrasound to confirm the miscarriage. Blood or urine tests may also be ordered. TREATMENT   Sometimes, treatment is not necessary if you naturally pass all the fetal tissue that was in the uterus. If some of the fetus or placenta remains in the body (incomplete miscarriage), tissue left behind may become infected and must be removed. Usually, a dilation and curettage (D and C) procedure is performed.  During a D and C procedure, the cervix is widened (dilated) and any remaining fetal or placental tissue is gently removed from the uterus.  Antibiotic medicines are prescribed if there is an infection. Other medicines may be given to reduce the size of the uterus (contract) if there is a lot of bleeding.  If you have Rh negative blood and your baby was Rh positive, you will need a Rh immunoglobulin shot. This shot will protect any future baby from having Rh blood problems in future pregnancies. HOME CARE INSTRUCTIONS   Your caregiver may order bed rest or may allow you to continue light activity. Resume activity as directed by your caregiver.  Have someone help with home and family responsibilities during this time.   Keep track of the number of sanitary pads you use each day and how soaked (saturated) they are. Write down this information.   Do not use tampons. Do not douche or have sexual intercourse until approved by your caregiver.   Only take over-the-counter or prescription medicines for pain or discomfort as directed by your caregiver.   Do not take aspirin. Aspirin can cause bleeding.   Keep all follow-up appointments with your caregiver.   If you or your partner have problems with grieving, talk to your caregiver or seek counseling to help cope with the pregnancy loss. Allow enough time to grieve before trying to get pregnant again.  SEEK IMMEDIATE MEDICAL CARE IF:   You have severe cramps or pain in your back or abdomen.  You have a fever.  You pass large blood clots (walnut-sized   or larger) ortissue from your vagina. Save any tissue for your caregiver to inspect.   Your bleeding increases.   You have a thick, bad-smelling vaginal discharge.  You become lightheaded, weak, or you faint.   You have chills.  MAKE SURE YOU:  Understand these instructions.  Will watch your condition.  Will get help right away if you are not doing well or get  worse. Document Released: 07/05/2000 Document Revised: 05/06/2012 Document Reviewed: 02/28/2011 ExitCare Patient Information 2015 [employer]. This information is not intended to replace advice given to you by your health care provider. Make sure you discuss any questions you have with your health care provider.  

## 2015-03-14 ENCOUNTER — Encounter (HOSPITAL_COMMUNITY): Payer: Self-pay | Admitting: Emergency Medicine

## 2015-03-14 ENCOUNTER — Emergency Department (HOSPITAL_COMMUNITY)
Admission: EM | Admit: 2015-03-14 | Discharge: 2015-03-14 | Disposition: A | Discharge status: Home / Self Care | Payer: Self-pay | Source: Home / Self Care | Attending: Family Medicine | Admitting: Family Medicine

## 2015-03-14 DIAGNOSIS — H66002 Acute suppurative otitis media without spontaneous rupture of ear drum, left ear: Secondary | ICD-10-CM

## 2015-03-14 DIAGNOSIS — K0889 Other specified disorders of teeth and supporting structures: Secondary | ICD-10-CM

## 2015-03-14 MED ORDER — NAPROXEN 500 MG PO TABS: 500.0000 mg | ORAL_TABLET | Freq: Two times a day (BID) | ORAL | Status: DC

## 2015-03-14 MED ORDER — AMOXICILLIN-POT CLAVULANATE 875-125 MG PO TABS: 1.0000 | ORAL_TABLET | Freq: Two times a day (BID) | ORAL | Status: DC

## 2015-03-14 NOTE — ED Provider Notes (Signed)
CSN: [id number]     Arrival date & time 03/14/15  1917 History   First MD Initiated Contact with Patient 03/14/15 2005     Chief Complaint  Patient presents with  . Dental Pain   (Consider location/radiation/quality/duration/timing/severity/associated sxs/prior Treatment) Patient is a 20 y.o. female presenting with tooth pain. The history is provided by the patient. No language interpreter was used.  Dental Pain Associated symptoms: no facial swelling and no fever    Patient presents with complaint of L ear pain, as well as L lower molar pain over the past 1 week that is worse when she eats.  She reports that her four wisdom teeth are erupting at the same time.  Has taken Excedrin with some relief.  Denies fevers or chills; has not had xerostomia or pain with opening/clenching jaw.   NKDA.    Past Medical History  Diagnosis Date  . UTI (urinary tract infection)    Past Surgical History  Procedure Laterality Date  . No past surgeries     Family History  Problem Relation Age of Onset  . Diabetes Maternal Grandmother    Social History  Substance Use Topics  . Smoking status: Current Every Day Smoker -- 0.50 packs/day for 1 years    Types: Cigarettes  . Smokeless tobacco: None  . Alcohol Use: No   OB History    Gravida Para Term Preterm AB TAB SAB Ectopic Multiple Living   0     Review of Systems  Constitutional: Negative for fever, chills, appetite change and fatigue.  HENT: Positive for dental problem and ear pain. Negative for ear discharge, facial swelling, nosebleeds, postnasal drip and rhinorrhea.   All other systems reviewed and are negative.   Allergies  Review of patient's allergies indicates no known allergies.  Home Medications   Prior to Admission medications   Medication Sig Start Date End Date Taking? Authorizing Provider  amoxicillin-clavulanate (AUGMENTIN) 875-125 MG tablet Take 1 tablet by mouth every 12 (twelve) hours. 03/14/15   Barbaraann Barthel, MD  hydrocortisone cream 1 % Apply to affected area 2 times daily as needed for swelling and pain Patient taking differently: Apply 1 application topically 2 (two) times daily. Apply to affected area 2 times daily as needed for swelling and pain 08/16/14   [name], PA-C  naproxen (NAPROSYN) 500 MG tablet Take 1 tablet (500 mg total) by mouth 2 (two) times daily with a meal. 03/14/15   Barbaraann Barthel, MD   Meds Ordered and Administered this Visit  Medications - No data to display  BP 102/64 mmHg  Pulse 73  Temp(Src) 98.7 F (37.1 C) (Oral)  Resp 17  SpO2 100%  LMP 03/11/2015  Breastfeeding? Unknown No data found.   Physical Exam  Constitutional: She appears well-developed and well-nourished. No distress.  HENT:  Head: Normocephalic and atraumatic.  Right Ear: External ear normal.  Nose: Nose normal.  Mouth/Throat: Oropharynx is clear and moist. No oropharyngeal exudate.  LEFT TM erythematous, intact.  No tenderness with palpation of pinna/tragus.  No periauricular adenopathy.   Moist mucus membranes. Erupting wisdom teeth, no erythema of gingiva.   Eyes: Conjunctivae and EOM are normal. Pupils are equal, round, and reactive to light. Right eye exhibits no discharge. Left eye exhibits no discharge.  Neck: Neck supple. No thyromegaly present.  Lymphadenopathy:    She has no cervical adenopathy.  Skin: She is not diaphoretic.    ED  Course  Procedures (including critical care time)  Labs Review Labs Reviewed - No data to display  Imaging Review No results found.   Visual Acuity Review  Right Eye Distance:   Left Eye Distance:   Bilateral Distance:    Right Eye Near:   Left Eye Near:    Bilateral Near:         MDM   1. Acute suppurative otitis media of left ear without spontaneous rupture of tympanic membrane, recurrence not specified   2. Pain, dental    LEFT otitis media; augmentin.   Pain associated with erupting wisdom teeth.  NSAIDs,  requires evaluation of dentist. She reports that she has a way to access a dentist and will arrange.   Paula Compton, MD    Barbaraann Barthel, MD 03/14/15 2025

## 2015-03-14 NOTE — ED Notes (Signed)
The patient presented to the [facility] with a complaint of pain from a wisdom tooth cutting x 1 week.

## 2015-03-14 NOTE — Discharge Instructions (Signed)
It was a pleasure to see you today.    I am diagnosing and treating you for a left ear infection.  Take AUGMENTIN 875/125 tablets, take 1 tablet by mouth every 12 hours until gone.   For the dental pain, NAPROXEN  tablets, take 1 tablet by mouth every 12 hours as needed for pain. Take with food.   Your dental pain requires the intervention of a dentist.

## 2015-07-25 ENCOUNTER — Encounter (HOSPITAL_COMMUNITY): Payer: Self-pay

## 2015-07-25 ENCOUNTER — Emergency Department (HOSPITAL_COMMUNITY)
Admission: EM | Admit: 2015-07-25 | Discharge: 2015-07-25 | Disposition: A | Discharge status: Home / Self Care | Payer: MEDICAID | Attending: Emergency Medicine | Admitting: Emergency Medicine

## 2015-07-25 DIAGNOSIS — K0889 Other specified disorders of teeth and supporting structures: Secondary | ICD-10-CM | POA: Insufficient documentation

## 2015-07-25 DIAGNOSIS — F17210 Nicotine dependence, cigarettes, uncomplicated: Secondary | ICD-10-CM | POA: Insufficient documentation

## 2015-07-25 MED ORDER — LIDOCAINE VISCOUS 2 % MT SOLN: 15.0000 mL | OROMUCOSAL | Status: DC | PRN

## 2015-07-25 MED ORDER — NAPROXEN 500 MG PO TABS
500.0000 mg | ORAL_TABLET | Freq: Two times a day (BID) | ORAL | Status: DC
Start: 2015-07-25 — End: 2015-09-03

## 2015-07-25 MED ORDER — PENICILLIN V POTASSIUM 500 MG PO TABS: 500.0000 mg | ORAL_TABLET | Freq: Four times a day (QID) | ORAL | Status: AC

## 2015-07-25 NOTE — Discharge Instructions (Signed)
You have been seen today for tooth pain. Follow up with a dentist as soon as possible. Refer to the resource guide for options or call the number above to set up an appointment. Follow up with PCP as needed.   Dental Resource Guide  [facility] [address], [address] [place], [place] [address] [phone]  [facility] [place] [address] [place], [place] [address] [phone]  [facility] [address] [place], [place] [address] [phone]. [phone]  [facility] [address] [place], [place] [address] [phone]  [facility] [address] [place], [place] [address] [phone]  [facility] [email or web address]/[email or web address]  [employer] [address] [facility] [place], [place] [address] [phone]  Website for free, low-income, or sliding scale dental services in [place]: www.freedental.us  To find a dentist in [place] and surrounding areas: [email or web address]/for-the-public/find-[email or web address]-dentist  [facility] [employer].athttp://www.ncdental.org/meetings-events/[place]-missions-of-mercy  [facility] Medicaid Dentist http://[email or web address]/https://dma.ncdhhs.gov/find-[email or web address]-doctor/medicaid-dental-providers

## 2015-07-25 NOTE — ED Provider Notes (Signed)
CSN: [id number]     Arrival date & time 07/25/15  1328 History  By signing my name below, I, Destiny Wu, attest that this documentation has been prepared under the direction and in the presence of non-physician practitioner, Harolyn RutherfordShawn Athalie Newhard, PA-C. Electronically Signed: Freida Busmaniana Wu, Scribe. 07/25/2015. 2:34 PM.    Chief Complaint  Patient presents with  . Dental Pain   The history is provided by the patient. No language interpreter was used.     HPI Comments:  Destiny Wu is a 20 y.o. female who presents to the Emergency Department complaining of gradually worsening, 8/10, dental pain that began a week ago, worse this AM. Pt states all four of her wisdom teeth are coming in and causing her pain. Denies fever/chills, nausea/vomiting, difficulty breathing or swallowing, or any other complaints. No alleviating factors noted.      Past Medical History  Diagnosis Date  . UTI (urinary tract infection)    Past Surgical History  Procedure Laterality Date  . No past surgeries     Family History  Problem Relation Age of Onset  . Diabetes Maternal Grandmother    Social History  Substance Use Topics  . Smoking status: Current Every Day Smoker -- 0.50 packs/day for 1 years    Types: Cigarettes  . Smokeless tobacco: None  . Alcohol Use: No   OB History    Gravida Para Term Preterm AB TAB SAB Ectopic Multiple Living   2 1  1       0     Review of Systems  Constitutional: Negative for fever and chills.  HENT: Positive for dental problem.   Respiratory: Negative for shortness of breath.   Cardiovascular: Negative for chest pain.  Gastrointestinal: Negative for vomiting.      Allergies  Review of patient's allergies indicates no known allergies.  Home Medications   Prior to Admission medications   Medication Sig Start Date End Date Taking? Authorizing Provider  amoxicillin-clavulanate (AUGMENTIN) 875-125 MG tablet Take 1 tablet by mouth every 12 (twelve) hours.  03/14/15   Barbaraann BarthelJames O Breen, MD  hydrocortisone cream 1 % Apply to affected area 2 times daily as needed for swelling and pain Patient taking differently: Apply 1 application topically 2 (two) times daily. Apply to affected area 2 times daily as needed for swelling and pain 08/16/14   Marlon Peliffany Greene, PA-C  lidocaine (XYLOCAINE) 2 % solution Use as directed 15 mLs in the mouth or throat as needed for mouth pain. 07/25/15   Archie Shea C Torie Priebe, PA-C  naproxen (NAPROSYN) 500 MG tablet Take 1 tablet (500 mg total) by mouth 2 (two) times daily with a meal. 03/14/15   Barbaraann BarthelJames O Breen, MD  naproxen (NAPROSYN) 500 MG tablet Take 1 tablet (500 mg total) by mouth 2 (two) times daily. 07/25/15   Kosei Rhodes C Ashwath Lasch, PA-C  penicillin v potassium (VEETID) 500 MG tablet Take 1 tablet (500 mg total) by mouth 4 (four) times daily. 07/25/15 08/01/15  Faryn Sieg C Lenyx Boody, PA-C   BP 118/75 mmHg  Pulse 80  Temp(Src) 99.2 F (37.3 C)  Resp 18  Ht 5\' 4"  (1.626 m)  Wt 74.844 kg  BMI 28.31 kg/m2  SpO2 99% Physical Exam  Constitutional: She appears well-developed and well-nourished. No distress.  HENT:  Head: Normocephalic and atraumatic.  Partially erupted 3rd molars x 4  No discernable swelling or area of fluctuance   Eyes: Conjunctivae are normal.  Neck: Neck supple.  Cardiovascular: Normal rate and regular rhythm.  Pulmonary/Chest: Effort normal.  Neurological: She is alert.  Skin: Skin is warm and dry. She is not diaphoretic.  Psychiatric: She has a normal mood and affect. Her behavior is normal.  Nursing note and vitals reviewed.   ED Course  Procedures   DIAGNOSTIC STUDIES:  Oxygen Saturation is 99% on RA, normal by my interpretation.    COORDINATION OF CARE:  2:31 PM Discussed treatment plan with pt at bedside and pt agreed to plan.   MDM   Final diagnoses:  Pain, dental    Patient with dentalgia x4.  Patient's exam was significant for emerging third molars, as the patient describes. No abscess requiring immediate  incision and drainage.  Exam not concerning for Ludwig's angina or pharyngeal abscess.  Naproxen, viscous lidocaine, and penicillin prescribed. Pt instructed to follow-up with dentist.  Discussed return precautions. Pt appears safe for discharge.   I personally performed the services described in this documentation, which was scribed in my presence. The recorded information has been reviewed and is accurate.   Anselm PancoastShawn C Arthea Nobel, PA-C 07/25/15 1645  Mancel BaleElliott Wentz, MD 07/26/15 [phone]

## 2015-07-25 NOTE — ED Notes (Signed)
Declined W/C at D/C and was escorted to lobby by RN. 

## 2015-07-25 NOTE — ED Notes (Signed)
Patient complains of wisdom teeth coming through and causing increased pain, NAD

## 2015-09-03 ENCOUNTER — Encounter (HOSPITAL_COMMUNITY): Payer: Self-pay | Admitting: Emergency Medicine

## 2015-09-03 ENCOUNTER — Emergency Department (HOSPITAL_COMMUNITY)
Admission: EM | Admit: 2015-09-03 | Discharge: 2015-09-03 | Disposition: A | Discharge status: Home / Self Care | Payer: Self-pay | Attending: Emergency Medicine | Admitting: Emergency Medicine

## 2015-09-03 ENCOUNTER — Emergency Department (HOSPITAL_COMMUNITY): Payer: Self-pay

## 2015-09-03 DIAGNOSIS — M25522 Pain in left elbow: Secondary | ICD-10-CM

## 2015-09-03 DIAGNOSIS — F17210 Nicotine dependence, cigarettes, uncomplicated: Secondary | ICD-10-CM | POA: Insufficient documentation

## 2015-09-03 DIAGNOSIS — M25422 Effusion, left elbow: Secondary | ICD-10-CM | POA: Insufficient documentation

## 2015-09-03 MED ORDER — NAPROXEN 500 MG PO TABS: 500.0000 mg | ORAL_TABLET | Freq: Two times a day (BID) | ORAL | 0 refills | Status: DC

## 2015-09-03 NOTE — ED Provider Notes (Signed)
[facility]EM[facility]ENCY DEPT Provider Note   CSN: [id number] Arrival date & time: 09/03/15  [id number]  First Provider Contact:  None       History   Chief Complaint Chief Complaint  Patient presents with  . Elbow Pain    HPI Destiny Wu is a 20 y.o. female who presents emergency Department with complaint of left elbow pain. Patient states that it began to ache last night and she thought she might have pulled a muscle. This morning her elbow joint, feels stiff. She is able to move the joint, but it hurts to fully extend the elbow. She denies any known injuries or previous elbow pain. She denies fevers or chills. She denies localized heat or redness. She denies a history of gout. 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 She'll call out of work this morning. She states that her elbow is so uncomfortable she is not able to lie on that side.  HPI  Past Medical History:  Diagnosis Date  . UTI (urinary tract infection)     There are no active problems to display for this patient.   Past Surgical History:  Procedure Laterality Date  . NO PAST SURGERIES      OB History    Gravida Para Term Preterm AB Living   2 1   1    0   SAB TAB Ectopic Multiple Live Births                   Home Medications    Prior to Admission medications   Medication Sig Start Date End Date Taking? Authorizing Provider  naproxen (NAPROSYN) 500 MG tablet Take 1 tablet (500 mg total) by mouth 2 (two) times daily. 09/03/15   Arthor CaptainAbigail Keiandre Cygan, PA-C    Family History Family History  Problem Relation Age of Onset  . Diabetes Maternal Grandmother     Social History Social History  Substance Use Topics  . Smoking status: Current Every Day Smoker    Packs/day: 0.50    Years: 1.00    Types: Cigarettes  . Smokeless tobacco: Not on file  . Alcohol use No     Allergies   Review of patient's allergies indicates no known allergies.   Review of Systems Review  of Systems Ten systems reviewed and are negative for acute change, except as noted in the HPI.    Physical Exam Updated Vital Signs BP 112/62 (BP Location: Right Arm)   Pulse 61   Temp 98.3 F (36.8 C) (Oral)   Resp 16   Ht 5\' 4"  (1.626 m)   Wt 79.5 kg   LMP 08/24/2015 (Approximate)   SpO2 100%   BMI 30.09 kg/m   Physical Exam  Constitutional: She is oriented to person, place, and time. She appears well-developed and well-nourished. No distress.  HENT:  Head: Normocephalic and atraumatic.  Eyes: Conjunctivae are normal. No scleral icterus.  Neck: Normal range of motion.  Cardiovascular: Normal rate, regular rhythm and normal heart sounds.  Exam reveals no gallop and no friction rub.   No murmur heard. Pulmonary/Chest: Effort normal and breath sounds normal. No respiratory distress.  Abdominal: Soft. Bowel sounds are normal. She exhibits no distension and no mass. There is no tenderness. There is no guarding.  Musculoskeletal:  Left elbow with FROM. Non-tender to palpation. Pain with full extension. No visible swelling. No deformity, bruising or crepitus.  Neurological: She is alert and oriented to person, place, and time.  Skin: Skin is warm and dry. She is not diaphoretic.  Nursing note and vitals reviewed.    ED Treatments / Results  Labs (all labs ordered are listed, but only abnormal results are displayed) Labs Reviewed - No data to display  EKG  EKG Interpretation None       Radiology Dg Elbow Complete Left  Result Date: 09/03/2015 CLINICAL DATA:  Acute onset of left elbow pain.  Initial encounter. EXAM: LEFT ELBOW - COMPLETE 3+ VIEW COMPARISON:  None. FINDINGS: A large elbow joint effusion is noted. No definite displaced fracture is characterized. Visualized soft tissues are otherwise grossly unremarkable. IMPRESSION: Large elbow joint effusion noted.  No definite evidence of fracture. Electronically Signed   By: Roanna Raider M.D.   On: 09/03/2015 06:15     Procedures Procedures (including critical care time)  Medications Ordered in ED Medications - No data to display   Initial Impression / Assessment and Plan / ED Course  I have reviewed the triage vital signs and the nursing notes.  Pertinent labs & imaging results that were available during my care of the patient were reviewed by me and considered in my medical decision making (see chart for details).  Clinical Course    Patient with atraumatic joint effusion on plain film. No visible swelling. No heat, redness or swelling suggestive of infection or acute inflammatory process like gout.  Will provide supportive care with RICE. F/u with Dr. Lajoyce Corners if sxs worsen or do not improve.  Final Clinical Impressions(s) / ED Diagnoses   Final diagnoses:  Elbow effusion, left  Elbow pain, left    New Prescriptions Discharge Medication List as of 09/03/2015  9:05 AM    START taking these medications   Details  naproxen (NAPROSYN) 500 MG tablet Take 1 tablet (500 mg total) by mouth 2 (two) times daily., Starting Fri 09/03/2015, Print         Arthor Captain, PA-C 09/03/15 1602    Tilden Fossa, MD 09/04/15 [phone]

## 2015-09-03 NOTE — ED Triage Notes (Signed)
Pt. reports left elbow pain with stiffness onset last night , denies injury , no swelling or deformity.

## 2015-09-03 NOTE — ED Notes (Signed)
Pt provided discharge instructions, verbalized understanding. vss and pt ambulatory upon discharge.

## 2015-10-19 ENCOUNTER — Encounter: Payer: Self-pay | Admitting: *Deleted

## 2015-12-03 ENCOUNTER — Encounter (HOSPITAL_COMMUNITY): Payer: Self-pay | Admitting: Emergency Medicine

## 2015-12-03 ENCOUNTER — Emergency Department (HOSPITAL_COMMUNITY)
Admission: EM | Admit: 2015-12-03 | Discharge: 2015-12-03 | Disposition: A | Discharge status: Home / Self Care | Payer: Self-pay | Attending: Emergency Medicine | Admitting: Emergency Medicine

## 2015-12-03 DIAGNOSIS — J988 Other specified respiratory disorders: Secondary | ICD-10-CM | POA: Insufficient documentation

## 2015-12-03 DIAGNOSIS — F17210 Nicotine dependence, cigarettes, uncomplicated: Secondary | ICD-10-CM | POA: Insufficient documentation

## 2015-12-03 DIAGNOSIS — B9789 Other viral agents as the cause of diseases classified elsewhere: Secondary | ICD-10-CM

## 2015-12-03 LAB — URINALYSIS, ROUTINE W REFLEX MICROSCOPIC
Glucose, UA: NEGATIVE mg/dL
Ketones, ur: 15 mg/dL — AB
Leukocytes, UA: NEGATIVE
NITRITE: NEGATIVE
Protein, ur: 30 mg/dL — AB
Specific Gravity, Urine: 1.028 (ref 1.005–1.030)
pH: 6 (ref 5.0–8.0)

## 2015-12-03 LAB — URINE MICROSCOPIC-ADD ON

## 2015-12-03 LAB — RAPID STREP SCREEN (MED CTR MEBANE ONLY): STREPTOCOCCUS, GROUP A SCREEN (DIRECT): NEGATIVE

## 2015-12-03 MED ORDER — HYDROCODONE-ACETAMINOPHEN 7.5-325 MG/15ML PO SOLN: 10.0000 mL | Freq: Four times a day (QID) | ORAL | 0 refills | Status: DC | PRN

## 2015-12-03 MED ORDER — IBUPROFEN 800 MG PO TABS
800.0000 mg | ORAL_TABLET | Freq: Once | ORAL | Status: AC
  Administered 2015-12-03: 800 mg via ORAL
  Filled 2015-12-03: qty 1

## 2015-12-03 MED ORDER — IBUPROFEN 800 MG PO TABS: 800.0000 mg | ORAL_TABLET | Freq: Three times a day (TID) | ORAL | 0 refills | Status: DC | PRN

## 2015-12-03 NOTE — ED Triage Notes (Signed)
C/o sore throat, cough (green sputum), nasal congestion, bilateral ear pain x 2 days.' ALSO, c/o bilateral lower abd pain and lower back pain x 2 days. Denies vaginal discharge. Describes pain as constant.

## 2015-12-03 NOTE — ED Provider Notes (Signed)
[facility]-EMERGENCY DEPT Provider Note   CSN: [id number] Arrival date & time: 12/03/15  [id number]     History   Chief Complaint Chief Complaint  Patient presents with  . Abdominal Pain  . Sore Throat  . Back Pain  . Cough    HPI Destiny Wu is a 20 y.o. female.  HPI   Patient presents with URI symptoms and myalgias that began two days ago.  Symptoms developed with sore throat, nasal congestion, sweating, and myalgias. Then developed cough productive of green sputum.  Denies SOB, CP, urinary, vaginal, or bowel symptoms.     Past Medical History:  Diagnosis Date  . UTI (urinary tract infection)     There are no active problems to display for this patient.   Past Surgical History:  Procedure Laterality Date  . NO PAST SURGERIES      OB History    Gravida Para Term Preterm AB Living   2 1   1    0   SAB TAB Ectopic Multiple Live Births                   Home Medications    Prior to Admission medications   Medication Sig Start Date End Date Taking? Authorizing Provider  aspirin-acetaminophen-caffeine (EXCEDRIN MIGRAINE) [id number] MG tablet Take 2 tablets by mouth every 6 (six) hours as needed for headache.   Yes Historical Provider, MD  HYDROcodone-acetaminophen (HYCET) 7.5-325 mg/15 ml solution Take 10 mLs by mouth 4 (four) times daily as needed for moderate pain or severe pain. 12/03/15   Trixie DredgeEmily Ishaq Maffei, PA-C  ibuprofen (ADVIL,MOTRIN) 800 MG tablet Take 1 tablet (800 mg total) by mouth every 8 (eight) hours as needed for mild pain or moderate pain. 12/03/15   Trixie DredgeEmily Tationa Stech, PA-C  naproxen (NAPROSYN) 500 MG tablet Take 1 tablet (500 mg total) by mouth 2 (two) times daily. Patient not taking: Reported on 12/03/2015 09/03/15   Arthor CaptainAbigail Harris, PA-C    Family History Family History  Problem Relation Age of Onset  . Diabetes Maternal Grandmother     Social History Social History  Substance Use Topics  . Smoking status: Current Every Day Smoker    Packs/day: 0.50     Years: 1.00    Types: Cigarettes  . Smokeless tobacco: Never Used  . Alcohol use No     Allergies   Patient has no known allergies.   Review of Systems Review of Systems  All other systems reviewed and are negative.    Physical Exam Updated Vital Signs BP 114/71   Pulse 89   Temp 99.9 F (37.7 C) (Oral)   Resp 18   LMP 11/13/2015   SpO2 100%   Physical Exam  Constitutional: She appears well-developed and well-nourished. No distress.  HENT:  Head: Normocephalic and atraumatic.  Mouth/Throat: Uvula is midline. Mucous membranes are not dry. No trismus in the jaw. No dental abscesses or uvula swelling. Posterior oropharyngeal erythema present. No oropharyngeal exudate, posterior oropharyngeal edema or tonsillar abscesses.  Eyes: Conjunctivae are normal.  Neck: Normal range of motion. Neck supple.  Cardiovascular: Normal rate and regular rhythm.   Pulmonary/Chest: Effort normal and breath sounds normal. No stridor. No respiratory distress. She has no wheezes. She has no rales.  Lymphadenopathy:    She has cervical adenopathy.  Neurological: She is alert.  Skin: She is not diaphoretic.  Nursing note and vitals reviewed.    ED Treatments / Results  Labs (all labs ordered are listed, but only abnormal  results are displayed) Labs Reviewed  URINALYSIS, ROUTINE W REFLEX MICROSCOPIC (NOT AT [facility]) - Abnormal; Notable for the following:       Result Value   Color, Urine AMBER (*)    APPearance CLOUDY (*)    Hgb urine dipstick SMALL (*)    Bilirubin Urine SMALL (*)    Ketones, ur 15 (*)    Protein, ur 30 (*)    All other components within normal limits  URINE MICROSCOPIC-ADD ON - Abnormal; Notable for the following:    Squamous Epithelial / LPF 0-5 (*)    Bacteria, UA RARE (*)    All other components within normal limits  RAPID STREP SCREEN (NOT AT [facility])  CULTURE, GROUP A STREP [facility])    EKG  EKG Interpretation None       Radiology No results  found.  Procedures Procedures (including critical care time)  Medications Ordered in ED Medications  ibuprofen (ADVIL,MOTRIN) tablet 800 mg (800 mg Oral Given 12/03/15 1031)     Initial Impression / Assessment and Plan / ED Course  I have reviewed the triage vital signs and the nursing notes.  Pertinent labs & imaging results that were available during my care of the patient were reviewed by me and considered in my medical decision making (see chart for details).  Clinical Course    Afebrile, nontoxic patient with constellation of symptoms suggestive of viral syndrome.  No concerning findings on exam.  Discharged home with supportive care, PCP follow up.  Discussed result, findings, treatment, and follow up  with patient.  Pt given return precautions.  Pt verbalizes understanding and agrees with plan.      Final Clinical Impressions(s) / ED Diagnoses   Final diagnoses:  Viral respiratory infection    New Prescriptions Discharge Medication List as of 12/03/2015 10:56 AM    START taking these medications   Details  HYDROcodone-acetaminophen (HYCET) 7.5-325 mg/15 ml solution Take 10 mLs by mouth 4 (four) times daily as needed for moderate pain or severe pain., Starting Fri 12/03/2015, Print    ibuprofen (ADVIL,MOTRIN) 800 MG tablet Take 1 tablet (800 mg total) by mouth every 8 (eight) hours as needed for mild pain or moderate pain., Starting Fri 12/03/2015, Print         DaveyEmily Shayana Hornstein, PA-C 12/03/15 1146    Melene Planan Floyd, DO 12/04/15 1654

## 2015-12-03 NOTE — ED Notes (Signed)
Pt is in stable condition upon d/c and ambulates from ED. 

## 2015-12-03 NOTE — Discharge Instructions (Signed)
Read the information below.  Use the prescribed medication as directed.  Please discuss all new medications with your pharmacist.  You may return to the Emergency Department at any time for worsening condition or any new symptoms that concern you.  If you develop high fevers that do not resolve with tylenol or ibuprofen, you have difficulty swallowing or breathing, or you are unable to tolerate fluids by mouth, return to the ER for a recheck.    °

## 2015-12-05 LAB — CULTURE, GROUP A STREP (THRC)

## 2015-12-17 ENCOUNTER — Emergency Department (HOSPITAL_COMMUNITY)
Admission: EM | Admit: 2015-12-17 | Discharge: 2015-12-17 | Disposition: A | Discharge status: Home / Self Care | Payer: Self-pay | Attending: Emergency Medicine | Admitting: Emergency Medicine

## 2015-12-17 ENCOUNTER — Encounter (HOSPITAL_COMMUNITY): Payer: Self-pay

## 2015-12-17 ENCOUNTER — Emergency Department (HOSPITAL_COMMUNITY): Payer: Self-pay

## 2015-12-17 DIAGNOSIS — Z7982 Long term (current) use of aspirin: Secondary | ICD-10-CM | POA: Insufficient documentation

## 2015-12-17 DIAGNOSIS — F17210 Nicotine dependence, cigarettes, uncomplicated: Secondary | ICD-10-CM | POA: Insufficient documentation

## 2015-12-17 DIAGNOSIS — Z79899 Other long term (current) drug therapy: Secondary | ICD-10-CM | POA: Insufficient documentation

## 2015-12-17 DIAGNOSIS — L509 Urticaria, unspecified: Secondary | ICD-10-CM | POA: Insufficient documentation

## 2015-12-17 LAB — BASIC METABOLIC PANEL
Anion gap: 6 (ref 5–15)
BUN: 14 mg/dL (ref 6–20)
CALCIUM: 9.6 mg/dL (ref 8.9–10.3)
CO2: 27 mmol/L (ref 22–32)
Chloride: 106 mmol/L (ref 101–111)
Creatinine, Ser: 0.78 mg/dL (ref 0.44–1.00)
GFR calc non Af Amer: >60 mL/min (ref >60)
Glucose, Bld: 91 mg/dL (ref 65–99)
Potassium: 5.1 mmol/L (ref 3.5–5.1)
SODIUM: 139 mmol/L (ref 135–145)

## 2015-12-17 LAB — CBC
HCT: 40.6 % (ref 36.0–46.0)
Hemoglobin: 13.7 g/dL (ref 12.0–15.0)
MCH: 31.4 pg (ref 26.0–34.0)
MCHC: 33.7 g/dL (ref 30.0–36.0)
MCV: 92.9 fL (ref 78.0–100.0)
PLATELETS: 323 10*3/uL (ref 150–400)
RBC: 4.37 MIL/uL (ref 3.87–5.11)
RDW: 14.1 % (ref 11.5–15.5)
WBC: 9 10*3/uL (ref 4.0–10.5)

## 2015-12-17 LAB — I-STAT TROPONIN, ED: TROPONIN I, POC: 0 ng/mL (ref 0.00–0.08)

## 2015-12-17 MED ORDER — DIPHENHYDRAMINE HCL 50 MG/ML IJ SOLN
25.0000 mg | Freq: Once | INTRAMUSCULAR | Status: AC
  Administered 2015-12-17: 25 mg via INTRAVENOUS
  Filled 2015-12-17: qty 1

## 2015-12-17 MED ORDER — DIPHENHYDRAMINE HCL 25 MG PO TABS: 25.0000 mg | ORAL_TABLET | Freq: Four times a day (QID) | ORAL | 0 refills | Status: DC

## 2015-12-17 MED ORDER — EPINEPHRINE 0.15 MG/0.15ML IJ SOAJ: 0.1500 mg | INTRAMUSCULAR | 0 refills | Status: DC | PRN

## 2015-12-17 MED ORDER — SODIUM CHLORIDE 0.9 % IV BOLUS (SEPSIS)
1000.0000 mL | Freq: Once | INTRAVENOUS | Status: AC
  Administered 2015-12-17: 1000 mL via INTRAVENOUS

## 2015-12-17 MED ORDER — METHYLPREDNISOLONE SODIUM SUCC 125 MG IJ SOLR
125.0000 mg | Freq: Once | INTRAMUSCULAR | Status: AC
  Administered 2015-12-17: 125 mg via INTRAVENOUS
  Filled 2015-12-17: qty 2

## 2015-12-17 MED ORDER — PREDNISONE 10 MG PO TABS: 20.0000 mg | ORAL_TABLET | Freq: Every day | ORAL | 0 refills | Status: DC

## 2015-12-17 MED ORDER — FAMOTIDINE 20 MG PO TABS: 20.0000 mg | ORAL_TABLET | Freq: Two times a day (BID) | ORAL | 0 refills | Status: DC

## 2015-12-17 MED ORDER — FAMOTIDINE IN NACL 20-0.9 MG/50ML-% IV SOLN
20.0000 mg | Freq: Once | INTRAVENOUS | Status: AC
  Administered 2015-12-17: 20 mg via INTRAVENOUS
  Filled 2015-12-17: qty 50

## 2015-12-17 MED ORDER — LORATADINE 10 MG PO TABS: 10.0000 mg | ORAL_TABLET | Freq: Every day | ORAL | 0 refills | Status: DC

## 2015-12-17 NOTE — ED Triage Notes (Signed)
Pt states she thinks she ate something causing an allergic reaction; pt states she is breaking out in hives; pt also complain of chest discomfort with throat pain; Pt presents with moderate swelling/hives to chest, neck, and back area; pt a&ox 4 on arrival. Pt states the last thing she ate was banna pudding;

## 2015-12-17 NOTE — ED Notes (Signed)
BP decreased while patient lying down, sleeping. Repositioned patient. BP WNL. 103/60.

## 2015-12-17 NOTE — ED Notes (Signed)
Patient currently in xray at this time 

## 2015-12-17 NOTE — ED Provider Notes (Signed)
[facility]-EMERGENCY DEPT Provider Note   CSN: [id number] Arrival date & time: 12/17/15  0253  History   Chief Complaint Chief Complaint  Patient presents with  . Allergic Reaction    HPI Destiny Wu is a 20 y.o. female.  HPI   Pt to the ER with complaints of allergies due to rash or hives to entire torso. She also felt like her throat was scratchy. All of this has since resolved. The hives extend to her neck and back as well. She does not know what she is allergic too but she did have some banana pudding this evening.  No wheezing, CP, facial swelling, diarrhea, vomiting, abdominal pain headache or fever.  Past Medical History:  Diagnosis Date  . UTI (urinary tract infection)     There are no active problems to display for this patient.   Past Surgical History:  Procedure Laterality Date  . NO PAST SURGERIES      OB History    Gravida Para Term Preterm AB Living   2 1   1    0   SAB TAB Ectopic Multiple Live Births                   Home Medications    Prior to Admission medications   Medication Sig Start Date End Date Taking? Authorizing Provider  aspirin-acetaminophen-caffeine (EXCEDRIN MIGRAINE) [phone] MG tablet Take 2 tablets by mouth every 6 (six) hours as needed for headache.   Yes Historical Provider, MD  ibuprofen (ADVIL,MOTRIN) 800 MG tablet Take 1 tablet (800 mg total) by mouth every 8 (eight) hours as needed for mild pain or moderate pain. 12/03/15  Yes Trixie DredgeEmily West, PA-C  diphenhydrAMINE (BENADRYL) 25 MG tablet Take 1 tablet (25 mg total) by mouth every 6 (six) hours. 12/17/15   Cadden Elizondo Neva SeatGreene, PA-C  EPINEPHrine 0.15 MG/0.15ML IJ injection Inject 0.15 mLs (0.15 mg total) into the muscle as needed for anaphylaxis. 12/17/15   Sheniya Garciaperez Neva SeatGreene, PA-C  famotidine (PEPCID) 20 MG tablet Take 1 tablet (20 mg total) by mouth 2 (two) times daily. 12/17/15   Divinity Kyler Neva SeatGreene, PA-C  HYDROcodone-acetaminophen (HYCET) 7.5-325 mg/15 ml solution Take 10 mLs by  mouth 4 (four) times daily as needed for moderate pain or severe pain. Patient not taking: Reported on 12/17/2015 12/03/15   Trixie DredgeEmily West, PA-C  loratadine (CLARITIN) 10 MG tablet Take 1 tablet (10 mg total) by mouth daily. 12/17/15   Dontavia Brand Neva SeatGreene, PA-C  predniSONE (DELTASONE) 10 MG tablet Take 2 tablets (20 mg total) by mouth daily. 12/17/15   Marlon Peliffany Sherryn Pollino, PA-C    Family History Family History  Problem Relation Age of Onset  . Diabetes Maternal Grandmother     Social History Social History  Substance Use Topics  . Smoking status: Current Every Day Smoker    Packs/day: 0.50    Years: 1.00    Types: Cigarettes  . Smokeless tobacco: Never Used  . Alcohol use No     Allergies   Patient has no known allergies.   Review of Systems Review of Systems Review of Systems All other systems negative except as documented in the HPI. All pertinent positives and negatives as reviewed in the HPI.   Physical Exam Updated Vital Signs BP 100/65   Pulse (!) 55   Temp 97.7 F (36.5 C) (Oral)   Resp 18   Ht 5\' 4"  (1.626 m)   Wt 77.1 kg   LMP 12/13/2015   SpO2 96%   BMI 29.18 kg/m  Physical Exam  Constitutional: She appears well-developed and well-nourished.  HENT:  Head: Normocephalic and atraumatic.  No intraoral swelling  Eyes: Conjunctivae are normal. Pupils are equal, round, and reactive to light.  Neck: Trachea normal, normal range of motion and full passive range of motion without pain. Neck supple.  Cardiovascular: Normal rate, regular rhythm and normal pulses.   Pulmonary/Chest: Effort normal and breath sounds normal. Chest wall is not dull to percussion. She exhibits no tenderness, no crepitus, no edema, no deformity and no retraction.  No wheezing or stridor.  Abdominal: Soft. Normal appearance and bowel sounds are normal.  Musculoskeletal: Normal range of motion.  Neurological: She is alert. She has normal strength.  Skin: Skin is warm, dry and intact.  Diffuse  hives  Psychiatric: She has a normal mood and affect. Her speech is normal and behavior is normal. Judgment and thought content normal. Cognition and memory are normal.     ED Treatments / Results  Labs (all labs ordered are listed, but only abnormal results are displayed) Labs Reviewed  BASIC METABOLIC PANEL  CBC  I-STAT TROPOININ, ED    EKG  EKG Interpretation None       Radiology No results found.  Procedures Procedures (including critical care time)  Medications Ordered in ED Medications  sodium chloride 0.9 % bolus 1,000 mL (0 mLs Intravenous Stopped 12/17/15 0700)  diphenhydrAMINE (BENADRYL) injection 25 mg (25 mg Intravenous Given 12/17/15 0515)  methylPREDNISolone sodium succinate (SOLU-MEDROL) 125 mg/2 mL injection 125 mg (125 mg Intravenous Given 12/17/15 0516)  famotidine (PEPCID) IVPB 20 mg premix (0 mg Intravenous Stopped 12/17/15 0545)     Initial Impression / Assessment and Plan / ED Course  I have reviewed the triage vital signs and the nursing notes.  Pertinent labs & imaging results that were available during my care of the patient were reviewed by me and considered in my medical decision making (see chart for details).  Clinical Course      {Patient with urticarial eruption. No specific trigger. Discussed that urticaria can be trigger by stress heat cold and for unknown reasons. No signs of anaphylactic reaction; no new medications. Will treat with steroids, epi, benadryl, Claritin and pecid. Follow up with PCP in 2-3 days. Return precautions discussed. Pt is safe for discharge at this time.        Final Clinical Impressions(s) / ED Diagnoses   Final diagnoses:  Hives    New Prescriptions Discharge Medication List as of 12/17/2015  6:42 AM    START taking these medications   Details  diphenhydrAMINE (BENADRYL) 25 MG tablet Take 1 tablet (25 mg total) by mouth every 6 (six) hours., Starting Fri 12/17/2015, Print    EPINEPHrine 0.15  MG/0.15ML IJ injection Inject 0.15 mLs (0.15 mg total) into the muscle as needed for anaphylaxis., Starting Fri 12/17/2015, Print    famotidine (PEPCID) 20 MG tablet Take 1 tablet (20 mg total) by mouth 2 (two) times daily., Starting Fri 12/17/2015, Print    loratadine (CLARITIN) 10 MG tablet Take 1 tablet (10 mg total) by mouth daily., Starting Fri 12/17/2015, Print    predniSONE (DELTASONE) 10 MG tablet Take 2 tablets (20 mg total) by mouth daily., Starting Fri 12/17/2015, Print         Marlon Peliffany Malin Sambrano, PA-C 12/26/15 [id number]    Glynn OctaveStephen Rancour, MD 12/26/15 [phone]

## 2016-04-16 ENCOUNTER — Encounter (HOSPITAL_COMMUNITY): Payer: Self-pay | Admitting: *Deleted

## 2016-04-16 ENCOUNTER — Ambulatory Visit (HOSPITAL_COMMUNITY)
Admission: EM | Admit: 2016-04-16 | Discharge: 2016-04-16 | Disposition: A | Discharge status: Home / Self Care | Payer: Self-pay | Attending: Family Medicine | Admitting: Family Medicine

## 2016-04-16 DIAGNOSIS — J019 Acute sinusitis, unspecified: Secondary | ICD-10-CM

## 2016-04-16 DIAGNOSIS — J302 Other seasonal allergic rhinitis: Secondary | ICD-10-CM

## 2016-04-16 MED ORDER — FLUTICASONE PROPIONATE 50 MCG/ACT NA SUSP: 2.0000 | Freq: Every day | NASAL | 2 refills | Status: DC

## 2016-04-16 MED ORDER — CETIRIZINE-PSEUDOEPHEDRINE ER 5-120 MG PO TB12: 1.0000 | ORAL_TABLET | Freq: Every day | ORAL | 0 refills | Status: DC

## 2016-04-16 MED ORDER — AMOXICILLIN 500 MG PO CAPS: 500.0000 mg | ORAL_CAPSULE | Freq: Three times a day (TID) | ORAL | 0 refills | Status: DC

## 2016-04-16 NOTE — ED Triage Notes (Signed)
C/O nasal congestion, cough, left sided head pressure since yesterday.  Unsure if fevers.

## 2016-04-16 NOTE — ED Provider Notes (Signed)
CSN: [id number]     Arrival date & time 04/16/16  1948 History   First MD Initiated Contact with Patient 04/16/16 2030     Chief Complaint  Patient presents with  . Nasal Congestion  . Headache   (Consider location/radiation/quality/duration/timing/severity/associated sxs/prior Treatment)  HPI   Patient is a 21 year old female presenting today with complaints of head congestion and sore throat generalized weakness and nausea with a stuffy had for the past several days. Patient reports issues with seasonal allergies and she has not yet been taking any medication for this. Eyes other significant medical history. No allergies to medications.  Past Medical History:  Diagnosis Date  . UTI (urinary tract infection)    Past Surgical History:  Procedure Laterality Date  . NO PAST SURGERIES     Family History  Problem Relation Age of Onset  . Diabetes Maternal Grandmother    Social History  Substance Use Topics  . Smoking status: Current Every Day Smoker    Packs/day: 0.50    Years: 1.00    Types: Cigarettes  . Smokeless tobacco: Never Used  . Alcohol use No   OB History    Gravida Para Term Preterm AB Living   2 1   1    0   SAB TAB Ectopic Multiple Live Births                 Review of Systems  Constitutional: Positive for chills. Negative for fever.  HENT: Positive for sinus pain, sinus pressure and sore throat. Negative for sneezing.   Eyes: Negative.   Respiratory: Negative for cough, shortness of breath and wheezing.   Cardiovascular: Negative.   Gastrointestinal: Negative.   Endocrine: Negative.   Genitourinary: Negative.   Skin: Negative.  Negative for pallor and rash.  Allergic/Immunologic: Positive for environmental allergies. Negative for food allergies and immunocompromised state.  Neurological: Negative.   Hematological: Negative.   Psychiatric/Behavioral: Negative.     Allergies  Patient has no known allergies.  Home Medications   Prior to Admission  medications   Medication Sig Start Date End Date Taking? Authorizing Provider  amoxicillin (AMOXIL) 500 MG capsule Take 1 capsule (500 mg total) by mouth 3 (three) times daily. 04/16/16   Servando Salinaatherine H Mazal Ebey, NP  aspirin-acetaminophen-caffeine (EXCEDRIN MIGRAINE) [phone] MG tablet Take 2 tablets by mouth every 6 (six) hours as needed for headache.    Historical Provider, MD  cetirizine-pseudoephedrine (ZYRTEC-D) 5-120 MG tablet Take 1 tablet by mouth daily. 04/16/16   Servando Salinaatherine H Nasteho Glantz, NP  diphenhydrAMINE (BENADRYL) 25 MG tablet Take 1 tablet (25 mg total) by mouth every 6 (six) hours. 12/17/15   Tiffany Neva SeatGreene, PA-C  EPINEPHrine 0.15 MG/0.15ML IJ injection Inject 0.15 mLs (0.15 mg total) into the muscle as needed for anaphylaxis. 12/17/15   Tiffany Neva SeatGreene, PA-C  famotidine (PEPCID) 20 MG tablet Take 1 tablet (20 mg total) by mouth 2 (two) times daily. 12/17/15   Tiffany Neva SeatGreene, PA-C  fluticasone (FLONASE) 50 MCG/ACT nasal spray Place 2 sprays into both nostrils daily. 04/16/16   Servando Salinaatherine H Hadeel Hillebrand, NP  HYDROcodone-acetaminophen (HYCET) 7.5-325 mg/15 ml solution Take 10 mLs by mouth 4 (four) times daily as needed for moderate pain or severe pain. Patient not taking: Reported on 12/17/2015 12/03/15   Trixie DredgeEmily West, PA-C  ibuprofen (ADVIL,MOTRIN) 800 MG tablet Take 1 tablet (800 mg total) by mouth every 8 (eight) hours as needed for mild pain or moderate pain. 12/03/15   Trixie DredgeEmily West, PA-C  loratadine (CLARITIN) 10 MG tablet  Take 1 tablet (10 mg total) by mouth daily. 12/17/15   [name], PA-C  predniSONE (DELTASONE) 10 MG tablet Take 2 tablets (20 mg total) by mouth daily. 12/17/15   [name], PA-C   Meds Ordered and Administered this Visit  Medications - No data to display  BP 102/63   Pulse 71   Temp 98.7 F (37.1 C) (Oral)   Resp 16   LMP 04/03/2016 (Exact Date)   SpO2 100%   Breastfeeding? No  No data found.   Physical Exam  Constitutional: She is oriented to person, place, and  time. She appears well-developed and well-nourished. No distress.  HENT:  Head: Normocephalic and atraumatic.  Right Ear: External ear normal.  Left Ear: External ear normal.  Mouth/Throat: Oropharynx is clear and moist. No oropharyngeal exudate.  Bilateral tympanic membranes pearly gray in appearance with light reflexes present but distorted with fluid present behind TMs bilatearlly and bony prominences visualized. Bilateral nasal turbinates are patent, however they're swollen and boggy in appearance. Posterior oropharynx is beefy red with mucoid discharge present. Cobblestone pattern noted.   Eyes: Pupils are equal, round, and reactive to light. Right eye exhibits no discharge. Left eye exhibits no discharge. No scleral icterus.  Neck: Normal range of motion. Neck supple. No tracheal deviation present.  Mild bilateral cervical lymphadenopathy present.  Cardiovascular: Normal rate, regular rhythm, normal heart sounds and intact distal pulses.  Exam reveals no gallop and no friction rub.   No murmur heard. Pulmonary/Chest: Effort normal and breath sounds normal. No stridor. No respiratory distress. She has no wheezes. She has no rales. She exhibits no tenderness.  Lymphadenopathy:    She has cervical adenopathy.  Neurological: She is alert and oriented to person, place, and time.  Skin: Skin is warm and dry. No rash noted. She is not diaphoretic. No erythema. No pallor.  Nursing note and vitals reviewed.   Urgent Care Course     Procedures (including critical care time)  Labs Review Labs Reviewed - No data to display  Imaging Review No results found.  Discussed the importance of seasonal allergies prevention and taking her medication daily with the emergence of the spring and fall allergens. In addition discussed the possibility of using saline rinses to help alleviate some of her symptoms.   MDM   1. Acute sinusitis, recurrence not specified, unspecified location   2. Acute  seasonal allergic rhinitis due to other allergen    Meds ordered this encounter  Medications  . fluticasone (FLONASE) 50 MCG/ACT nasal spray    Sig: Place 2 sprays into both nostrils daily.    Dispense:  16 g    Refill:  2  . amoxicillin (AMOXIL) 500 MG capsule    Sig: Take 1 capsule (500 mg total) by mouth 3 (three) times daily.    Dispense:  21 capsule    Refill:  0  . cetirizine-pseudoephedrine (ZYRTEC-D) 5-120 MG tablet    Sig: Take 1 tablet by mouth daily.    Dispense:  15 tablet    Refill:  0    The usual and customary discharge instructions and warnings were given.  The patient verbalizes understanding and agrees to plan of care.      [name], NP 04/16/16 [phone]

## 2016-04-16 NOTE — Discharge Instructions (Signed)
A neti pot is a container designed to rinse debris or mucus from your nasal cavity. You might use a neti pot to treat symptoms of nasal allergies, sinus problems or colds. °If you choose to make your own saltwater solution, it's important to use bottled water that has been distilled or sterilized. Tap water is acceptable if it's been boiled for several minutes and then left to cool until it is lukewarm. °To use the neti pot, tilt your head sideways over the sink and place the spout of the neti pot in the upper nostril. Breathing through your open mouth, gently pour the saltwater solution into your upper nostril so that the liquid drains through the lower nostril. Repeat on the other side. °Be sure to rinse the irrigation device after each use with similarly distilled, sterile, previously boiled and cooled, or filtered water and leave open to air dry. °Neti pots are often available in pharmacies and health food stores, as well online.  ° °

## 2016-05-02 ENCOUNTER — Encounter (HOSPITAL_COMMUNITY): Payer: Self-pay

## 2016-05-02 ENCOUNTER — Emergency Department (HOSPITAL_COMMUNITY)
Admission: EM | Admit: 2016-05-02 | Discharge: 2016-05-02 | Discharge status: Against Medical Advice | Payer: Self-pay | Attending: Emergency Medicine | Admitting: Emergency Medicine

## 2016-05-02 DIAGNOSIS — R197 Diarrhea, unspecified: Secondary | ICD-10-CM | POA: Insufficient documentation

## 2016-05-02 DIAGNOSIS — F17210 Nicotine dependence, cigarettes, uncomplicated: Secondary | ICD-10-CM | POA: Insufficient documentation

## 2016-05-02 DIAGNOSIS — Z7982 Long term (current) use of aspirin: Secondary | ICD-10-CM | POA: Insufficient documentation

## 2016-05-02 DIAGNOSIS — R112 Nausea with vomiting, unspecified: Secondary | ICD-10-CM | POA: Insufficient documentation

## 2016-05-02 DIAGNOSIS — R109 Unspecified abdominal pain: Secondary | ICD-10-CM

## 2016-05-02 DIAGNOSIS — Z79899 Other long term (current) drug therapy: Secondary | ICD-10-CM | POA: Insufficient documentation

## 2016-05-02 LAB — COMPREHENSIVE METABOLIC PANEL
ALK PHOS: 51 U/L (ref 38–126)
ALT: 11 U/L — AB (ref 14–54)
AST: 18 U/L (ref 15–41)
Albumin: 3.6 g/dL (ref 3.5–5.0)
Anion gap: 9 (ref 5–15)
BILIRUBIN TOTAL: 0.6 mg/dL (ref 0.3–1.2)
BUN: 5 mg/dL — AB (ref 6–20)
CO2: 26 mmol/L (ref 22–32)
CREATININE: 0.77 mg/dL (ref 0.44–1.00)
Calcium: 9.3 mg/dL (ref 8.9–10.3)
Chloride: 104 mmol/L (ref 101–111)
GFR calc Af Amer: >60 mL/min (ref >60)
Glucose, Bld: 89 mg/dL (ref 65–99)
Potassium: 3.8 mmol/L (ref 3.5–5.1)
Sodium: 139 mmol/L (ref 135–145)
TOTAL PROTEIN: 7 g/dL (ref 6.5–8.1)

## 2016-05-02 LAB — URINALYSIS, ROUTINE W REFLEX MICROSCOPIC
BILIRUBIN URINE: NEGATIVE
GLUCOSE, UA: NEGATIVE mg/dL
Hgb urine dipstick: NEGATIVE
KETONES UR: 5 mg/dL — AB
LEUKOCYTES UA: NEGATIVE
NITRITE: NEGATIVE
PROTEIN: NEGATIVE mg/dL
Specific Gravity, Urine: 1.021 (ref 1.005–1.030)
pH: 5 (ref 5.0–8.0)

## 2016-05-02 LAB — CBC
HCT: 41.4 % (ref 36.0–46.0)
Hemoglobin: 14.1 g/dL (ref 12.0–15.0)
MCH: 31.4 pg (ref 26.0–34.0)
MCHC: 34.1 g/dL (ref 30.0–36.0)
MCV: 92.2 fL (ref 78.0–100.0)
PLATELETS: 285 10*3/uL (ref 150–400)
RBC: 4.49 MIL/uL (ref 3.87–5.11)
RDW: 13 % (ref 11.5–15.5)
WBC: 4.5 10*3/uL (ref 4.0–10.5)

## 2016-05-02 LAB — I-STAT BETA HCG BLOOD, ED (MC, WL, AP ONLY): I-stat hCG, quantitative: <5 m[IU]/mL (ref <5)

## 2016-05-02 LAB — LIPASE, BLOOD: Lipase: <10 U/L — ABNORMAL LOW (ref 11–51)

## 2016-05-02 MED ORDER — ONDANSETRON 4 MG PO TBDP
4.0000 mg | ORAL_TABLET | Freq: Once | ORAL | Status: AC
  Administered 2016-05-02: 4 mg via ORAL
  Filled 2016-05-02: qty 1

## 2016-05-02 NOTE — ED Triage Notes (Signed)
Patient complains of lower right abdominal pain x 1 day. States vomited x 1 last pm after eating. No dysuria. NAD

## 2016-05-02 NOTE — ED Notes (Signed)
PT still not abe to be found.

## 2016-05-02 NOTE — ED Notes (Signed)
Went in pt room to reassess with MD and pt not found to be in room. Will check again shortly to see if pt returned to room

## 2016-05-02 NOTE — ED Provider Notes (Signed)
[facility]-EMERGENCY DEPT Provider Note   CSN: [id number] Arrival date & time: 05/02/16  1413   By signing my name below, I, [name], attest that this documentation has been prepared under the direction and in the presence of [name], MD  Electronically Signed: Clovis Pu, ED Scribe. 05/02/16. 4:20 PM.   History   Chief Complaint Chief Complaint  Patient presents with  . Abdominal Pain  . Back Pain    HPI Comments:  Destiny Wu is a 21 y.o. female who presents to the Emergency Department complaining of acute onset, mild lower right sided abdominal pain x yesterday. She states her pain wraps around to her right lower back. Palpation and raising her right lower extremity aggravates her pain. She also reports nausea, vomiting with minimal blood streaking, watery diarrhea, subjective fevers, chills and recent sick contacts with similar symptoms. No alleviating factors noted. She denies dysuria or any other associated symptoms. Pt also denies recent antibiotic use or suspicious food intake. No other complaints noted at this time.   The history is provided by the patient. No language interpreter was used.  Abdominal Pain   This is a new problem. The current episode started yesterday. The problem has not changed since onset.The pain is associated with an unknown factor. The pain is mild. Associated symptoms include fever (subjective), diarrhea, nausea and vomiting. Pertinent negatives include dysuria. The symptoms are aggravated by palpation. Nothing relieves the symptoms.    Past Medical History:  Diagnosis Date  . UTI (urinary tract infection)     There are no active problems to display for this patient.   Past Surgical History:  Procedure Laterality Date  . NO PAST SURGERIES      OB History    Gravida Para Term Preterm AB Living   0   SAB TAB Ectopic Multiple Live Births                   Home Medications    Prior to Admission  medications   Medication Sig Start Date End Date Taking? Authorizing Provider  amoxicillin (AMOXIL) 500 MG capsule Take 1 capsule (500 mg total) by mouth 3 (three) times daily. 04/16/16   [name], NP  aspirin-acetaminophen-caffeine (EXCEDRIN MIGRAINE) [phone] MG tablet Take 2 tablets by mouth every 6 (six) hours as needed for headache.    Historical Provider, MD  cetirizine-pseudoephedrine (ZYRTEC-D) 5-120 MG tablet Take 1 tablet by mouth daily. 04/16/16   [name], NP  diphenhydrAMINE (BENADRYL) 25 MG tablet Take 1 tablet (25 mg total) by mouth every 6 (six) hours. 12/17/15   [name], PA-C  EPINEPHrine 0.15 MG/0.15ML IJ injection Inject 0.15 mLs (0.15 mg total) into the muscle as needed for anaphylaxis. 12/17/15   [name], PA-C  famotidine (PEPCID) 20 MG tablet Take 1 tablet (20 mg total) by mouth 2 (two) times daily. 12/17/15   [name], PA-C  fluticasone (FLONASE) 50 MCG/ACT nasal spray Place 2 sprays into both nostrils daily. 04/16/16   [name], NP  HYDROcodone-acetaminophen (HYCET) 7.5-325 mg/15 ml solution Take 10 mLs by mouth 4 (four) times daily as needed for moderate pain or severe pain. Patient not taking: Reported on 12/17/2015 12/03/15   [name], PA-C  ibuprofen (ADVIL,MOTRIN) 800 MG tablet Take 1 tablet (800 mg total) by mouth every 8 (eight) hours as needed for mild pain or moderate pain. 12/03/15   [name], PA-C  loratadine (CLARITIN) 10  MG tablet Take 1 tablet (10 mg total) by mouth daily. 12/17/15   [name], PA-C  predniSONE (DELTASONE) 10 MG tablet Take 2 tablets (20 mg total) by mouth daily. 12/17/15   [name], PA-C    Family History Family History  Problem Relation Age of Onset  . Diabetes Maternal Grandmother     Social History Social History  Substance Use Topics  . Smoking status: Current Every Day Smoker    Packs/day: 0.50    Years: 1.00    Types: Cigarettes  . Smokeless tobacco: Never Used  .  Alcohol use No     Allergies   Patient has no known allergies.   Review of Systems Review of Systems  Constitutional: Positive for fever (subjective).  Gastrointestinal: Positive for abdominal pain, diarrhea, nausea and vomiting.  Genitourinary: Negative for dysuria.   All other systems reviewed and are negative for acute change except as noted in the HPI.  Physical Exam Updated Vital Signs BP 125/80 (BP Location: Left Arm)   Pulse 93   Temp 98.4 F (36.9 C) (Oral)   Resp 20   LMP 04/03/2016 (Exact Date)   SpO2 100%   Physical Exam  Constitutional: She is oriented to person, place, and time. She appears well-developed and well-nourished. No distress.  HENT:  Head: Normocephalic and atraumatic.  Nose: Nose normal.  Eyes: Conjunctivae and EOM are normal. Pupils are equal, round, and reactive to light. Right eye exhibits no discharge. Left eye exhibits no discharge. No scleral icterus.  Neck: Normal range of motion. Neck supple.  Cardiovascular: Normal rate and regular rhythm.  Exam reveals no gallop and no friction rub.   No murmur heard. Pulmonary/Chest: Effort normal and breath sounds normal. No stridor. No respiratory distress. She has no rales.  Abdominal: Soft. She exhibits no distension. There is no hepatosplenomegaly. There is tenderness (mild discomfort) in the right upper quadrant, right lower quadrant and suprapubic area. There is no rigidity, no rebound, no guarding and negative Murphy's sign. No hernia.  Musculoskeletal: She exhibits no edema or tenderness.  Neurological: She is alert and oriented to person, place, and time.  Skin: Skin is warm and dry. No rash noted. She is not diaphoretic. No erythema.  Psychiatric: She has a normal mood and affect.  Vitals reviewed.    ED Treatments / Results  DIAGNOSTIC STUDIES:  Oxygen Saturation is 98% on RA, normal by my interpretation.    COORDINATION OF CARE:  4:19 PM Discussed treatment plan with pt at bedside  and pt agreed to plan.  Labs (all labs ordered are listed, but only abnormal results are displayed) Labs Reviewed  LIPASE, BLOOD - Abnormal; Notable for the following:       Result Value   Lipase <10 (*)    All other components within normal limits  COMPREHENSIVE METABOLIC PANEL - Abnormal; Notable for the following:    BUN 5 (*)    ALT 11 (*)    All other components within normal limits  URINALYSIS, ROUTINE W REFLEX MICROSCOPIC - Abnormal; Notable for the following:    APPearance HAZY (*)    Ketones, ur 5 (*)    All other components within normal limits  CBC  I-STAT BETA HCG BLOOD, ED ([facility], [facility], [facility] ONLY)    EKG  EKG Interpretation None       Radiology No results found.  Procedures Procedures (including critical care time)  Medications Ordered in ED Medications  ondansetron (ZOFRAN-ODT) disintegrating tablet 4 mg (4 mg  Oral Given 05/02/16 1622)     Initial Impression / Assessment and Plan / ED Course  I have reviewed the triage vital signs and the nursing notes.  Pertinent labs & imaging results that were available during my care of the patient were reviewed by me and considered in my medical decision making (see chart for details).     21 y.o. female presents with vomiting, diarrhea, and subjective fever for 2 days. Possible suspicious food intake. Rest of history as above.  Patient appears well, not in distress, and with no signs of toxicity or dehydration. Abdomen w/o signs of peritonitis.  Rest of the exam as above  Labs grossly reassuring. HCG negative. UA w/o infection.  Most consistent with viral gastroenteritis vs food poisoning.   Doubt appendicitis, diverticulitis, severe colitis, dysentery.    Able to tolerate oral intake in the ED.  Discussed symptomatic treatment with the patient and they will follow closely with their PCP.  The patient is safe for discharge with strict return precautions.   Final Clinical Impressions(s) / ED Diagnoses    Final diagnoses:  Nausea vomiting and diarrhea  Abdominal discomfort   Pt eloped prior to providing her with discharge paperwork.   I personally performed the services described in this documentation, which was scribed in my presence. The recorded information has been reviewed and is accurate.        [name], MD 05/02/16 [phone]

## 2016-05-02 NOTE — ED Notes (Signed)
Patient is the room from triage patient is in a gown waiting on provider

## 2016-05-02 NOTE — ED Notes (Signed)
Pt given ginger ale and encouraged PO intake

## 2016-06-24 ENCOUNTER — Inpatient Hospital Stay (HOSPITAL_COMMUNITY)
Admission: AD | Admit: 2016-06-24 | Discharge: 2016-06-24 | Disposition: A | Discharge status: Home / Self Care | Payer: Self-pay | Source: Ambulatory Visit | Attending: Obstetrics & Gynecology | Admitting: Obstetrics & Gynecology

## 2016-06-24 ENCOUNTER — Encounter (HOSPITAL_COMMUNITY): Payer: Self-pay | Admitting: *Deleted

## 2016-06-24 DIAGNOSIS — Z3A01 Less than 8 weeks gestation of pregnancy: Secondary | ICD-10-CM | POA: Insufficient documentation

## 2016-06-24 DIAGNOSIS — Z3490 Encounter for supervision of normal pregnancy, unspecified, unspecified trimester: Secondary | ICD-10-CM

## 2016-06-24 DIAGNOSIS — L02215 Cutaneous abscess of perineum: Secondary | ICD-10-CM

## 2016-06-24 DIAGNOSIS — O26891 Other specified pregnancy related conditions, first trimester: Secondary | ICD-10-CM | POA: Insufficient documentation

## 2016-06-24 DIAGNOSIS — O99331 Smoking (tobacco) complicating pregnancy, first trimester: Secondary | ICD-10-CM | POA: Insufficient documentation

## 2016-06-24 DIAGNOSIS — O9989 Other specified diseases and conditions complicating pregnancy, childbirth and the puerperium: Secondary | ICD-10-CM

## 2016-06-24 DIAGNOSIS — L0291 Cutaneous abscess, unspecified: Secondary | ICD-10-CM | POA: Insufficient documentation

## 2016-06-24 LAB — URINALYSIS, ROUTINE W REFLEX MICROSCOPIC
BACTERIA UA: NONE SEEN
Bilirubin Urine: NEGATIVE
Glucose, UA: NEGATIVE mg/dL
HGB URINE DIPSTICK: NEGATIVE
KETONES UR: NEGATIVE mg/dL
Leukocytes, UA: NEGATIVE
NITRITE: NEGATIVE
Protein, ur: 30 mg/dL — AB
Specific Gravity, Urine: 1.027 (ref 1.005–1.030)
pH: 6 (ref 5.0–8.0)

## 2016-06-24 LAB — POCT PREGNANCY, URINE: PREG TEST UR: POSITIVE — AB

## 2016-06-24 MED ORDER — LIDOCAINE HCL (PF) 1 % IJ SOLN
INTRAMUSCULAR | Status: AC
  Administered 2016-06-24: 30 mL
  Filled 2016-06-24: qty 30

## 2016-06-24 MED ORDER — BUTALBITAL-APAP-CAFFEINE 50-325-40 MG PO TABS: 1.0000 | ORAL_TABLET | Freq: Four times a day (QID) | ORAL | 0 refills | Status: DC | PRN

## 2016-06-24 MED ORDER — LIDOCAINE HCL (PF) 1 % IJ SOLN
INTRAMUSCULAR | Status: AC
  Filled 2016-06-24: qty 30

## 2016-06-24 MED ORDER — CEPHALEXIN 500 MG PO CAPS: 500.0000 mg | ORAL_CAPSULE | Freq: Four times a day (QID) | ORAL | 0 refills | Status: DC

## 2016-06-24 NOTE — Progress Notes (Addendum)
G3P1 early pregnancy . Missed period May 25. April 25 last period. Presents to triage for missed period. Had +HPT yesterday. Bump under left cheeks of buttock near vaginal area.   1837: Provider at bs assessing. Abscess lancet and drained with use of lidocaine. Pain level 4/10  1905: Discharge instructions given with pt understanding. Wants something for pain. Provider notified and ordered pain med.   1910: Px for pain given and explained to pt with understanding. Pt left unit via wheelchair out to the car.

## 2016-06-24 NOTE — MAU Provider Note (Signed)
History   G3P0110 @ 5.4 wks in with lump on her botton for several weeks. Pain is 9/10.  CSN: [id number]  Arrival date & time 06/24/16  1759   None     Chief Complaint  Patient presents with  . Possible Pregnancy  . Mass    HPI  Past Medical History:  Diagnosis Date  . Medical history non-contributory   . UTI (urinary tract infection)     Past Surgical History:  Procedure Laterality Date  . NO PAST SURGERIES      Family History  Problem Relation Age of Onset  . Diabetes Maternal Grandmother   . Asthma Maternal Grandmother   . Hypertension Maternal Grandmother   . Asthma Mother   . Diabetes Mother   . Hypertension Mother   . Diabetes Father   . Asthma Maternal Grandfather   . Diabetes Maternal Grandfather   . Hypertension Maternal Grandfather     Social History  Substance Use Topics  . Smoking status: Current Every Day Smoker    Packs/day: 0.50    Years: 1.00    Types: Cigarettes  . Smokeless tobacco: Never Used  . Alcohol use No    OB History    Gravida Para Term Preterm AB Living   4 1   1    0   SAB TAB Ectopic Multiple Live Births                  Review of Systems  Constitutional: Negative.   HENT: Negative.   Eyes: Negative.   Respiratory: Negative.   Cardiovascular: Negative.   Gastrointestinal: Negative.   Endocrine: Negative.   Genitourinary:       Perineal pain and abscess  Musculoskeletal: Negative.   Allergic/Immunologic: Negative.   Neurological: Negative.   Hematological: Negative.   Psychiatric/Behavioral: Negative.     Allergies  Patient has no known allergies.  Home Medications   Current Outpatient Rx  . Order #: [id number] Class: Normal    BP 115/65 (BP Location: Right Leg)   Pulse 80   Temp 99.2 F (37.3 C) (Oral)   Resp 18   Ht 5\' 4"  (1.626 m)   Wt 165 lb 12 oz (75.2 kg)   LMP 05/16/2016   SpO2 100%   BMI 28.45 kg/m   Physical Exam  Constitutional: She is oriented to person, place, and time. She appears  well-developed and well-nourished.  HENT:  Head: Normocephalic.  Eyes: Pupils are equal, round, and reactive to light.  Neck: Normal range of motion.  Cardiovascular: Normal rate, regular rhythm, normal heart sounds and intact distal pulses.   Pulmonary/Chest: Effort normal and breath sounds normal.  Abdominal: Soft.  Genitourinary:  Genitourinary Comments: lg perineal abscess  Musculoskeletal: Normal range of motion.  Neurological: She is alert and oriented to person, place, and time. She has normal reflexes.  Skin: Skin is warm and dry.  Psychiatric: She has a normal mood and affect. Her behavior is normal. Judgment and thought content normal.    MAU Course  Procedures (including critical care time)  Labs Reviewed  URINALYSIS, ROUTINE W REFLEX MICROSCOPIC - Abnormal; Notable for the following:       Result Value   Protein, ur 30 (*)    Squamous Epithelial / LPF 0-5 (*)    All other components within normal limits  POCT PREGNANCY, URINE - Abnormal; Notable for the following:    Preg Test, Ur POSITIVE (*)    All other components within normal limits   No  results found.   1. Pregnancy at early stage   2. Perineal abscess       MDM  VSS, large perineal abscess, tender to touch. Red swollen. Discussed treatment options with pt. Consent obtained. Area infiltrated with 4 cc 1% lidocaine and I and d with #11 knife blade using sterile technique. Large amt mucopurulent drainage note. Will d/c home on oral keflex and Sitz baths TID. Message to clinic to get pt in for follow up in one week.

## 2016-06-24 NOTE — MAU Note (Signed)
Missed period. +HPT yesterday.  Has a bump, under buttock, first noted 2 days ago. Seems to be getting bigger. Hurts to sit

## 2016-06-24 NOTE — Discharge Instructions (Signed)
Incision and Drainage Incision and drainage is a surgical procedure to open and drain a fluid-filled sac. The sac may be filled with pus, mucus, or blood. Examples of fluid-filled sacs that may need surgical drainage include cysts, skin infections (abscesses), and red lumps that develop from a ruptured cyst or a small abscess (boils). You may need this procedure if the affected area is large, painful, infected, or not healing well. Tell a health care provider about:  Any allergies you have.  All medicines you are taking, including vitamins, herbs, eye drops, creams, and over-the-counter medicines.  Any problems you or family members have had with anesthetic medicines.  Any blood disorders you have.  Any surgeries you have had.  Any medical conditions you have.  Whether you are pregnant or may be pregnant. What are the risks? Generally, this is a safe procedure. However, problems may occur, including:  Infection.  Bleeding.  Allergic reactions to medicines.  Scarring.  What happens before the procedure?  You may need an ultrasound or other imaging tests to see how large or deep the fluid-filled sac is.  You may have blood tests to check for infection.  You may get a tetanus shot.  You may be given antibiotic medicine to help prevent infection.  Follow instructions from your health care provider about eating or drinking restrictions.  Ask your health care provider about: ? Changing or stopping your regular medicines. This is especially important if you are taking diabetes medicines or blood thinners. ? Taking medicines such as aspirin and ibuprofen. These medicines can thin your blood. Do not take these medicines before your procedure if your health care provider instructs you not to.  Plan to have someone take you home after the procedure.  If you will be going home right after the procedure, plan to have someone stay with you for 24 hours. What happens during the  procedure?  To reduce your risk of infection: ? Your health care team will wash or sanitize their hands. ? Your skin will be washed with soap.  You will be given one or more of the following: ? A medicine to help you relax (sedative). ? A medicine to numb the area (local anesthetic). ? A medicine to make you fall asleep (general anesthetic).  An incision will be made in the top of the fluid-filled sac.  The contents of the sac may be squeezed out, or a syringe or tube (catheter)may be used to empty the sac.  The catheter may be left in place for several weeks to drain any fluid. Or, your health care provider may stitch open the edges of the incision to make a long-term opening for drainage (marsupialization).  The inside of the sac may be washed out (irrigated) with a sterile solution and packed with gauze before it is covered with a bandage (dressing). The procedure may vary among health care providers and hospitals. What happens after the procedure?  Your blood pressure, heart rate, breathing rate, and blood oxygen level will be monitored often until the medicines you were given have worn off.  Do not drive for 24 hours if you received a sedative. This information is not intended to replace advice given to you by your health care provider. Make sure you discuss any questions you have with your health care provider. Document Released: 07/05/2000 Document Revised: 06/17/2015 Document Reviewed: 10/30/2014 Elsevier Interactive Patient Education  2017 [employer]  

## 2016-07-06 ENCOUNTER — Ambulatory Visit (INDEPENDENT_AMBULATORY_CARE_PROVIDER_SITE_OTHER): Payer: Self-pay | Admitting: Obstetrics and Gynecology

## 2016-07-06 ENCOUNTER — Telehealth: Payer: Self-pay | Admitting: *Deleted

## 2016-07-06 ENCOUNTER — Encounter: Payer: Self-pay | Admitting: Obstetrics and Gynecology

## 2016-07-06 VITALS — BP 115/67 | HR 56 | Wt 169.1 lb

## 2016-07-06 DIAGNOSIS — O3680X0 Pregnancy with inconclusive fetal viability, not applicable or unspecified: Secondary | ICD-10-CM

## 2016-07-06 MED ORDER — PRENATAL VITAMINS 0.8 MG PO TABS: 1.0000 | ORAL_TABLET | Freq: Every day | ORAL | 12 refills | Status: DC

## 2016-07-06 NOTE — Telephone Encounter (Signed)
Patient was scheduled with [facility] for TV u/s, however needed to be scheduled with u/s, appointment same date and time, called patient and notified her that she will have her scan in u/s not [facility]fm. Understanding voiced.

## 2016-07-06 NOTE — Progress Notes (Signed)
OB[facility] Visit [facility] 07/06/2016  CC: f/u MAU  Subjective:   Patient had 06/24/2016 perineal I&D for abscess. Patient put on keflex; no cultures done. Patient feeling much better and states s/s started a few days prior to presenting to MAU  No fevers, chills, dysuria.    Objective:    BP 115/67   Pulse (!) 56   Wt 169 lb 1.6 oz (76.7 kg)   LMP 05/16/2016   BMI 29.03 kg/m  NAD Left labia at 4 o'clock approx 3-4cm away from the introitus. Well healed, nttp, closed, no erythema or e/o infection. Nttp.   Assessment:   Patient doing well  Plan:   Told okay to stop the keflex since she's been on it since the 2nd and healed fine. Will set up for viability u/s prior to NOB on 6/27.  Cornelia Copaharlie Karen Kinnard, Jr MD [facility]ending [facility] 07/06/2016 Time: 1108am

## 2016-07-19 ENCOUNTER — Encounter: Payer: Self-pay | Admitting: Obstetrics & Gynecology

## 2016-07-19 ENCOUNTER — Ambulatory Visit (HOSPITAL_COMMUNITY): Payer: Self-pay

## 2016-07-19 ENCOUNTER — Ambulatory Visit (HOSPITAL_COMMUNITY)
Admission: RE | Admit: 2016-07-19 | Discharge: 2016-07-19 | Disposition: A | Discharge status: Home / Self Care | Payer: Self-pay | Source: Ambulatory Visit | Attending: Obstetrics and Gynecology | Admitting: Obstetrics and Gynecology

## 2016-07-19 ENCOUNTER — Ambulatory Visit (INDEPENDENT_AMBULATORY_CARE_PROVIDER_SITE_OTHER): Payer: Self-pay | Admitting: Obstetrics & Gynecology

## 2016-07-19 VITALS — BP 95/55 | HR 75 | Wt 171.8 lb

## 2016-07-19 DIAGNOSIS — O09299 Supervision of pregnancy with other poor reproductive or obstetric history, unspecified trimester: Secondary | ICD-10-CM | POA: Insufficient documentation

## 2016-07-19 DIAGNOSIS — O0990 Supervision of high risk pregnancy, unspecified, unspecified trimester: Secondary | ICD-10-CM | POA: Insufficient documentation

## 2016-07-19 DIAGNOSIS — Z113 Encounter for screening for infections with a predominantly sexual mode of transmission: Secondary | ICD-10-CM

## 2016-07-19 DIAGNOSIS — O0991 Supervision of high risk pregnancy, unspecified, first trimester: Secondary | ICD-10-CM

## 2016-07-19 DIAGNOSIS — O3680X0 Pregnancy with inconclusive fetal viability, not applicable or unspecified: Secondary | ICD-10-CM | POA: Insufficient documentation

## 2016-07-19 DIAGNOSIS — Z124 Encounter for screening for malignant neoplasm of cervix: Secondary | ICD-10-CM

## 2016-07-19 DIAGNOSIS — Z3A08 8 weeks gestation of pregnancy: Secondary | ICD-10-CM | POA: Insufficient documentation

## 2016-07-19 DIAGNOSIS — O09291 Supervision of pregnancy with other poor reproductive or obstetric history, first trimester: Secondary | ICD-10-CM

## 2016-07-19 LAB — POCT URINALYSIS DIP (DEVICE)
Bilirubin Urine: NEGATIVE
GLUCOSE, UA: NEGATIVE mg/dL
Hgb urine dipstick: NEGATIVE
Ketones, ur: NEGATIVE mg/dL
LEUKOCYTES UA: NEGATIVE
NITRITE: NEGATIVE
Protein, ur: NEGATIVE mg/dL
SPECIFIC GRAVITY, URINE: 1.02 (ref 1.005–1.030)
UROBILINOGEN UA: 0.2 mg/dL (ref 0.0–1.0)
pH: 6.5 (ref 5.0–8.0)

## 2016-07-19 NOTE — Patient Instructions (Addendum)
Thank you for enrolling in MyChart. Please follow the instructions below to securely access your online medical record. MyChart allows you to send messages to your doctor, view your test results, manage appointments, and more.   How Do I Sign Up? 1. In your Internet browser, go to [employer] and enter [email or web address]. 2. Click on the Sign Up Now link in the Sign In box. You will see the New Member Sign Up page. 3. Enter your MyChart Access Code exactly as it appears below. You will not need to use this code after you've completed the sign-up process. If you do not sign up before the expiration date, you must request a new code.  MyChart Access Code: [email or web address] Expires: 09/17/2016  1:40 PM  4. Enter your Social Security Number ([id number]) and Date of Birth (mm/dd/yyyy) as indicated and click Submit. You will be taken to the next sign-up page. 5. Create a MyChart ID. This will be your MyChart login ID and cannot be changed, so think of one that is secure and easy to remember. 6. Create a MyChart password. You can change your password at any time. 7. Enter your Password Reset Question and Answer. This can be used at a later time if you forget your password.  8. Enter your e-mail address. You will receive e-mail notification when new information is available in MyChart. 9. Click Sign Up. You can now view your medical record.   Additional Information Remember, MyChart is NOT to be used for urgent needs. For medical emergencies, dial 911.     First Trimester of Pregnancy The first trimester of pregnancy is from week 1 until the end of week 13 (months 1 through 3). A week after a sperm fertilizes an egg, the egg will implant on the wall of the uterus. This embryo will begin to develop into a baby. Genes from you and your partner will form the baby. The female genes will determine whether the baby will be a boy or a girl. At 6-8 weeks, the eyes and face will be formed, and  the heartbeat can be seen on ultrasound. At the end of 12 weeks, all the baby's organs will be formed. Now that you are pregnant, you will want to do everything you can to have a healthy baby. Two of the most important things are to get good prenatal care and to follow your health care provider's instructions. Prenatal care is all the medical care you receive before the baby's birth. This care will help prevent, find, and treat any problems during the pregnancy and childbirth. Body changes during your first trimester Your body goes through many changes during pregnancy. The changes vary from woman to woman.  You may gain or lose a couple of pounds at first.  You may feel sick to your stomach (nauseous) and you may throw up (vomit). If the vomiting is uncontrollable, call your health care provider.  You may tire easily.  You may develop headaches that can be relieved by medicines. All medicines should be approved by your health care provider.  You may urinate more often. Painful urination may mean you have a bladder infection.  You may develop heartburn as a result of your pregnancy.  You may develop constipation because certain hormones are causing the muscles that push stool through your intestines to slow down.  You may develop hemorrhoids or swollen veins (varicose veins).  Your breasts may begin to grow larger and become tender. Your nipples may stick out more, and  the tissue that surrounds them (areola) may become darker.  Your gums may bleed and may be sensitive to brushing and flossing.  Dark spots or blotches (chloasma, mask of pregnancy) may develop on your face. This will likely fade after the baby is born.  Your menstrual periods will stop.  You may have a loss of appetite.  You may develop cravings for certain kinds of food.  You may have changes in your emotions from day to day, such as being excited to be pregnant or being concerned that something may go wrong with the  pregnancy and baby.  You may have more vivid and strange dreams.  You may have changes in your hair. These can include thickening of your hair, rapid growth, and changes in texture. Some women also have hair loss during or after pregnancy, or hair that feels dry or thin. Your hair will most likely return to normal after your baby is born.  What to expect at prenatal visits During a routine prenatal visit:  You will be weighed to make sure you and the baby are growing normally.  Your blood pressure will be taken.  Your abdomen will be measured to track your baby's growth.  The fetal heartbeat will be listened to between weeks 10 and 14 of your pregnancy.  Test results from any previous visits will be discussed.  Your health care provider may ask you:  How you are feeling.  If you are feeling the baby move.  If you have had any abnormal symptoms, such as leaking fluid, bleeding, severe headaches, or abdominal cramping.  If you are using any tobacco products, including cigarettes, chewing tobacco, and electronic cigarettes.  If you have any questions.  Other tests that may be performed during your first trimester include:  Blood tests to find your blood type and to check for the presence of any previous infections. The tests will also be used to check for low iron levels (anemia) and protein on red blood cells (Rh antibodies). Depending on your risk factors, or if you previously had diabetes during pregnancy, you may have tests to check for high blood sugar that affects pregnant women (gestational diabetes).  Urine tests to check for infections, diabetes, or protein in the urine.  An ultrasound to confirm the proper growth and development of the baby.  Fetal screens for spinal cord problems (spina bifida) and Down syndrome.  HIV (human immunodeficiency virus) testing. Routine prenatal testing includes screening for HIV, unless you choose not to have this test.  You may need  other tests to make sure you and the baby are doing well.  Follow these instructions at home: Medicines  Follow your health care provider's instructions regarding medicine use. Specific medicines may be either safe or unsafe to take during pregnancy.  Take a prenatal vitamin that contains at least 600 micrograms (mcg) of folic acid.  If you develop constipation, try taking a stool softener if your health care provider approves. Eating and drinking  Eat a balanced diet that includes fresh fruits and vegetables, whole grains, good sources of protein such as meat, eggs, or tofu, and low-fat dairy. Your health care provider will help you determine the amount of weight gain that is right for you.  Avoid raw meat and uncooked cheese. These carry germs that can cause birth defects in the baby.  Eating four or five small meals rather than three large meals a day may help relieve nausea and vomiting. If you start to feel nauseous,  eating a few soda crackers can be helpful. Drinking liquids between meals, instead of during meals, also seems to help ease nausea and vomiting.  Limit foods that are high in fat and processed sugars, such as fried and sweet foods.  To prevent constipation: ? Eat foods that are high in fiber, such as fresh fruits and vegetables, whole grains, and beans. ? Drink enough fluid to keep your urine clear or pale yellow. Activity  Exercise only as directed by your health care provider. Most women can continue their usual exercise routine during pregnancy. Try to exercise for 30 minutes at least 5 days a week. Exercising will help you: ? Control your weight. ? Stay in shape. ? Be prepared for labor and delivery.  Experiencing pain or cramping in the lower abdomen or lower back is a good sign that you should stop exercising. Check with your health care provider before continuing with normal exercises.  Try to avoid standing for long periods of time. Move your legs often if  you must stand in one place for a long time.  Avoid heavy lifting.  Wear low-heeled shoes and practice good posture.  You may continue to have sex unless your health care provider tells you not to. Relieving pain and discomfort  Wear a good support bra to relieve breast tenderness.  Take warm sitz baths to soothe any pain or discomfort caused by hemorrhoids. Use hemorrhoid cream if your health care provider approves.  Rest with your legs elevated if you have leg cramps or low back pain.  If you develop varicose veins in your legs, wear support hose. Elevate your feet for 15 minutes, 3-4 times a day. Limit salt in your diet. Prenatal care  Schedule your prenatal visits by the twelfth week of pregnancy. They are usually scheduled monthly at first, then more often in the last 2 months before delivery.  Write down your questions. Take them to your prenatal visits.  Keep all your prenatal visits as told by your health care provider. This is important. Safety  Wear your seat belt at all times when driving.  Make a list of emergency phone numbers, including numbers for family, friends, the hospital, and police and fire departments. General instructions  Ask your health care provider for a referral to a local prenatal education class. Begin classes no later than the beginning of month 6 of your pregnancy.  Ask for help if you have counseling or nutritional needs during pregnancy. Your health care provider can offer advice or refer you to specialists for help with various needs.  Do not use hot tubs, steam rooms, or saunas.  Do not douche or use tampons or scented sanitary pads.  Do not cross your legs for long periods of time.  Avoid cat litter boxes and soil used by cats. These carry germs that can cause birth defects in the baby and possibly loss of the fetus by miscarriage or stillbirth.  Avoid all smoking, herbs, alcohol, and medicines not prescribed by your health care  provider. Chemicals in these products affect the formation and growth of the baby.  Do not use any products that contain nicotine or tobacco, such as cigarettes and e-cigarettes. If you need help quitting, ask your health care provider. You may receive counseling support and other resources to help you quit.  Schedule a dentist appointment. At home, brush your teeth with a soft toothbrush and be gentle when you floss. Contact a health care provider if:  You have dizziness.  You have mild pelvic cramps, pelvic pressure, or nagging pain in the abdominal area.  You have persistent nausea, vomiting, or diarrhea.  You have a bad smelling vaginal discharge.  You have pain when you urinate.  You notice increased swelling in your face, hands, legs, or ankles.  You are exposed to fifth disease or chickenpox.  You are exposed to [place] measles (rubella) and have never had it. Get help right away if:  You have a fever.  You are leaking fluid from your vagina.  You have spotting or bleeding from your vagina.  You have severe abdominal cramping or pain.  You have rapid weight gain or loss.  You vomit blood or material that looks like coffee grounds.  You develop a severe headache.  You have shortness of breath.  You have any kind of trauma, such as from a fall or a car accident. Summary  The first trimester of pregnancy is from week 1 until the end of week 13 (months 1 through 3).  Your body goes through many changes during pregnancy. The changes vary from woman to woman.  You will have routine prenatal visits. During those visits, your health care provider will examine you, discuss any test results you may have, and talk with you about how you are feeling. This information is not intended to replace advice given to you by your health care provider. Make sure you discuss any questions you have with your health care provider. Document Released: 01/03/2001 Document Revised:  12/22/2015 Document Reviewed: 12/22/2015 Elsevier Interactive Patient Education  2017 [employer].

## 2016-07-19 NOTE — Progress Notes (Signed)
Subjective:   Destiny Wu is a 21 y.o. G3P0110 at 6142w1d by LMP, early ultrasound being seen today for her first obstetrical visit.  Her obstetrical history is significant for history of prior stillbirth at 34 weeks; placenta noted to be thrombosed and infected on evaluation. No fetopsy. Pregnancy history fully reviewed.  Patient reports no complaints.  HISTORY: Obstetric History   G3   P1   T0   P1   A1   L0    SAB1   TAB0   Ectopic0   Multiple0   Live Births0     # Outcome Date GA Lbr Len/2nd Weight Sex Delivery Anes PTL Lv  3 Current           2 SAB 2016 5271w0d    SAB     1 Preterm 06/20/12 5061w6d 03:00 / 00:06 2 lb 11 oz (1.219 kg) F Vag-Spont None  FD     Name: Atlas,PENDINGBABY     Apgar1:  0                Apgar5: 0     Past Medical History:  Diagnosis Date  . Medical history non-contributory   . UTI (urinary tract infection)    Past Surgical History:  Procedure Laterality Date  . NO PAST SURGERIES     Family History  Problem Relation Age of Onset  . Diabetes Maternal Grandmother   . Asthma Maternal Grandmother   . Hypertension Maternal Grandmother   . Asthma Mother   . Diabetes Mother   . Hypertension Mother   . Diabetes Father   . Asthma Maternal Grandfather   . Diabetes Maternal Grandfather   . Hypertension Maternal Grandfather    Social History  Substance Use Topics  . Smoking status: Current Every Day Smoker    Packs/day: 0.50    Years: 1.00    Types: Cigarettes  . Smokeless tobacco: Never Used  . Alcohol use No   No Known Allergies Current Outpatient Prescriptions on File Prior to Visit  Medication Sig Dispense Refill  . Prenatal Multivit-Min-Fe-FA (PRENATAL VITAMINS) 0.8 MG tablet Take 1 tablet by mouth daily. 30 tablet 12   No current facility-administered medications on file prior to visit.      Exam   Vitals:   07/19/16 1321  BP: (!) 95/55  Pulse: 75  Weight: 171 lb 12.8 oz (77.9 kg)   Fetal Heart Rate (bpm): 156 on  u/s  Uterus:     Pelvic Exam: Perineum: no hemorrhoids, normal perineum   Vulva: normal external genitalia, no lesions   Vagina:  normal mucosa, normal discharge   Cervix: no lesions and normal, pap smear done.    Adnexa: normal adnexa and no mass, fullness, tenderness   Bony Pelvis: average  System: General: well-developed, well-nourished female in no acute distress   Breast:  normal appearance, no masses or tenderness   Skin: normal coloration and turgor, no rashes   Neurologic: oriented, normal, negative, normal mood   Extremities: normal strength, tone, and muscle mass, ROM of all joints is normal   HEENT PERRLA, extraocular movement intact and sclera clear, anicteric   Mouth/Teeth mucous membranes moist, pharynx normal without lesions and dental hygiene good   Neck supple and no masses   Cardiovascular: regular rate and rhythm   Respiratory:  no respiratory distress, normal breath sounds   Abdomen: soft, non-tender; bowel sounds normal; no masses,  no organomegaly     Assessment:   Pregnancy: [id number]  Patient Active Problem List   Diagnosis Date Noted  . Supervision of high-risk pregnancy 07/19/2016  . History of stillbirth in currently pregnant patient 07/19/2016     Plan:  1. History of stillbirth in pregnant patient in first trimester, antepartum Given placental thrombosed area, will do evaluation of throbophlias  - Antithrombin III - Lupus anticoagulant panel - Factor 5 leiden - Prothrombin gene mutation - Cardiolipin antibodies, IgG, IgM, IgA - Protein C activity - Protein S activity - [place] MFM Fetal Nuchal Translucency; Future  2. Supervision of high risk pregnancy, antepartum - Cytology - PAP - Obstetric Panel, Including HIV - Hemoglobinopathy evaluation - Culture, OB Urine - POCT urinalysis dip (device) - [place] MFM Fetal Nuchal Translucency; Future Initial labs drawn. Continue prenatal vitamins. Genetic Screening discussed, First trimester screen:  ordered. Ultrasound discussed; fetal anatomic survey: to be ordered later. Problem list reviewed and updated. The nature of [facility] with multiple MDs and other Advanced Practice Providers was explained to patient; also emphasized that residents, students are part of our team. Routine obstetric precautions reviewed. Return in about 4 weeks (around 08/16/2016) for OB Visit (HOB).     Jaynie Collins, MD, FACOG Attending Obstetrician & Gynecologist, [facility] for [employer], [facility]

## 2016-07-21 LAB — OBSTETRIC PANEL, INCLUDING HIV
Antibody Screen: NEGATIVE
BASOS ABS: 0 10*3/uL (ref 0.0–0.2)
Basos: 0 %
EOS (ABSOLUTE): 0 10*3/uL (ref 0.0–0.4)
Eos: 1 %
HIV Screen 4th Generation wRfx: NONREACTIVE
Hematocrit: 36.6 % (ref 34.0–46.6)
Hemoglobin: 12.5 g/dL (ref 11.1–15.9)
Hepatitis B Surface Ag: NEGATIVE
IMMATURE GRANS (ABS): 0 10*3/uL (ref 0.0–0.1)
IMMATURE GRANULOCYTES: 0 %
LYMPHS ABS: 2 10*3/uL (ref 0.7–3.1)
LYMPHS: 24 %
MCH: 31.6 pg (ref 26.6–33.0)
MCHC: 34.2 g/dL (ref 31.5–35.7)
MCV: 93 fL (ref 79–97)
MONOS ABS: 0.5 10*3/uL (ref 0.1–0.9)
Monocytes: 6 %
NEUTROS PCT: 69 %
Neutrophils Absolute: 5.6 10*3/uL (ref 1.4–7.0)
PLATELETS: 243 10*3/uL (ref 150–379)
RBC: 3.95 x10E6/uL (ref 3.77–5.28)
RDW: 13.9 % (ref 12.3–15.4)
RH TYPE: POSITIVE
RPR Ser Ql: NONREACTIVE
Rubella Antibodies, IGG: 5.02 index (ref >0.99)
WBC: 8.1 10*3/uL (ref 3.4–10.8)

## 2016-07-21 LAB — HEMOGLOBINOPATHY EVALUATION
HEMOGLOBIN A2 QUANTITATION: 2.5 % (ref 1.8–3.2)
HGB A: 97.5 % (ref 96.4–98.8)
HGB C: 0 %
HGB S: 0 %
HGB VARIANT: 0 %
Hemoglobin F Quantitation: 0 % (ref 0.0–2.0)

## 2016-07-21 LAB — CYTOLOGY - PAP
CHLAMYDIA, DNA PROBE: NEGATIVE
DIAGNOSIS: NEGATIVE
NEISSERIA GONORRHEA: NEGATIVE

## 2016-07-22 LAB — CULTURE, OB URINE

## 2016-07-22 LAB — URINE CULTURE, OB REFLEX

## 2016-07-24 ENCOUNTER — Encounter: Payer: Self-pay | Admitting: Obstetrics & Gynecology

## 2016-07-24 DIAGNOSIS — R8271 Bacteriuria: Secondary | ICD-10-CM | POA: Insufficient documentation

## 2016-07-24 LAB — CARDIOLIPIN ANTIBODIES, IGG, IGM, IGA

## 2016-07-24 LAB — FACTOR 5 LEIDEN

## 2016-07-24 LAB — PROTHROMBIN GENE MUTATION

## 2016-07-24 LAB — ANTITHROMBIN III: ANTITHROMB III FUNC: 97 % (ref 75–135)

## 2016-07-24 LAB — LUPUS ANTICOAGULANT PANEL
Dilute Viper Venom Time: 31.9 s (ref 0.0–47.0)
PTT Lupus Anticoagulant: 37.3 s (ref 0.0–51.9)

## 2016-07-24 LAB — PROTEIN C ACTIVITY: Protein C Activity: 100 % (ref 73–180)

## 2016-07-24 LAB — PROTEIN S ACTIVITY: Protein S Activity: 45 % — ABNORMAL LOW (ref 63–140)

## 2016-08-16 ENCOUNTER — Ambulatory Visit (INDEPENDENT_AMBULATORY_CARE_PROVIDER_SITE_OTHER): Payer: Self-pay | Admitting: Family Medicine

## 2016-08-16 ENCOUNTER — Encounter: Payer: Self-pay | Admitting: Family Medicine

## 2016-08-16 VITALS — BP 104/53 | HR 71 | Wt 175.3 lb

## 2016-08-16 DIAGNOSIS — L309 Dermatitis, unspecified: Secondary | ICD-10-CM | POA: Insufficient documentation

## 2016-08-16 DIAGNOSIS — O09291 Supervision of pregnancy with other poor reproductive or obstetric history, first trimester: Secondary | ICD-10-CM

## 2016-08-16 DIAGNOSIS — J3089 Other allergic rhinitis: Secondary | ICD-10-CM

## 2016-08-16 DIAGNOSIS — J309 Allergic rhinitis, unspecified: Secondary | ICD-10-CM | POA: Insufficient documentation

## 2016-08-16 DIAGNOSIS — O0992 Supervision of high risk pregnancy, unspecified, second trimester: Secondary | ICD-10-CM

## 2016-08-16 DIAGNOSIS — L2084 Intrinsic (allergic) eczema: Secondary | ICD-10-CM

## 2016-08-16 DIAGNOSIS — O0990 Supervision of high risk pregnancy, unspecified, unspecified trimester: Secondary | ICD-10-CM

## 2016-08-16 LAB — POCT URINALYSIS DIP (DEVICE)
Bilirubin Urine: NEGATIVE
Glucose, UA: NEGATIVE mg/dL
HGB URINE DIPSTICK: NEGATIVE
Ketones, ur: NEGATIVE mg/dL
LEUKOCYTES UA: NEGATIVE
NITRITE: NEGATIVE
PH: 5.5 (ref 5.0–8.0)
PROTEIN: NEGATIVE mg/dL
SPECIFIC GRAVITY, URINE: 1.025 (ref 1.005–1.030)
UROBILINOGEN UA: 0.2 mg/dL (ref 0.0–1.0)

## 2016-08-16 NOTE — Progress Notes (Addendum)
   PRENATAL VISIT NOTE  Subjective:  Destiny Wu is a 21 y.o. G3P0110 at 7888w1d being seen today for ongoing prenatal care.  She is currently monitored for the following issues for this high-risk pregnancy and has Supervision of high-risk pregnancy; History of stillbirth in currently pregnant patient; and Group B streptococcal bacteriuria on her problem list.  Patient reports no complaints. Reports some sinuses and allergies. Has eczema. Contractions: Not present. Vag. Bleeding: None.  Movement: Absent. Denies leaking of fluid.   The following portions of the patient's history were reviewed and updated as appropriate: allergies, current medications, past family history, past medical history, past social history, past surgical history and problem list. Problem list updated.  Objective:   Vitals:   08/16/16 1413  BP: (!) 104/53  Pulse: 71  Weight: 175 lb 4.8 oz (79.5 kg)    Fetal Status: Fetal Heart Rate (bpm): 147   Movement: Absent     General:  Alert, oriented and cooperative. Patient is in no acute distress.  Skin: Skin is warm and dry. No rash noted.   Cardiovascular: Normal heart rate noted  Respiratory: Normal respiratory effort, no problems with respiration noted  Abdomen: Soft, gravid, appropriate for gestational age.  Pain/Pressure: Absent     Pelvic: Cervical exam deferred        Extremities: Normal range of motion.  Edema: None  Mental Status:  Normal mood and affect. Normal behavior. Normal judgment and thought content.   Assessment and Plan:  Pregnancy: G3P0110 at 2988w1d  1. History of stillbirth in pregnant patient in first trimester, antepartum Will need antepartum testing W/u for coagulopathy negative  2. Supervision of high risk pregnancy in second trimester Nuchal translucency scheduled  3. Allergic Rhinitis List of OTC meds given  4. Eczema To let us know which med worked and we can call in for her.  Preterm labor symptoms and general  obstetric precautions including but not limited to vaginal bleeding, contractions, leaking of fluid and fetal movement were reviewed in detail with the patient. Please refer to After Visit Summary for other counseling recommendations.  Return in 4 weeks (on 09/13/2016).   Reva Boresanya S Finneus Kaneshiro, MD

## 2016-08-16 NOTE — Patient Instructions (Addendum)
You can take Mucinex, (Allegra, Claritin, Zyrtec-any one of these--they are the same class--same as Benadryl), sudafed, saline nasal spray, steroid nasal treatment   Second Trimester of Pregnancy The second trimester is from week 14 through week 27 (months 4 through 6). The second trimester is often a time when you feel your best. Your body has adjusted to being pregnant, and you begin to feel better physically. Usually, morning sickness has lessened or quit completely, you may have more energy, and you may have an increase in appetite. The second trimester is also a time when the fetus is growing rapidly. At the end of the sixth month, the fetus is about 9 inches long and weighs about 1 pounds. You will likely begin to feel the baby move (quickening) between 16 and 20 weeks of pregnancy. Body changes during your second trimester Your body continues to go through many changes during your second trimester. The changes vary from woman to woman.  Your weight will continue to increase. You will notice your lower abdomen bulging out.  You may begin to get stretch marks on your hips, abdomen, and breasts.  You may develop headaches that can be relieved by medicines. The medicines should be approved by your health care provider.  You may urinate more often because the fetus is pressing on your bladder.  You may develop or continue to have heartburn as a result of your pregnancy.  You may develop constipation because certain hormones are causing the muscles that push waste through your intestines to slow down.  You may develop hemorrhoids or swollen, bulging veins (varicose veins).  You may have back pain. This is caused by: ? Weight gain. ? Pregnancy hormones that are relaxing the joints in your pelvis. ? A shift in weight and the muscles that support your balance.  Your breasts will continue to grow and they will continue to become tender.  Your gums may bleed and may be sensitive to brushing  and flossing.  Dark spots or blotches (chloasma, mask of pregnancy) may develop on your face. This will likely fade after the baby is born.  A dark line from your belly button to the pubic area (linea nigra) may appear. This will likely fade after the baby is born.  You may have changes in your hair. These can include thickening of your hair, rapid growth, and changes in texture. Some women also have hair loss during or after pregnancy, or hair that feels dry or thin. Your hair will most likely return to normal after your baby is born.  What to expect at prenatal visits During a routine prenatal visit:  You will be weighed to make sure you and the fetus are growing normally.  Your blood pressure will be taken.  Your abdomen will be measured to track your baby's growth.  The fetal heartbeat will be listened to.  Any test results from the previous visit will be discussed.  Your health care provider may ask you:  How you are feeling.  If you are feeling the baby move.  If you have had any abnormal symptoms, such as leaking fluid, bleeding, severe headaches, or abdominal cramping.  If you are using any tobacco products, including cigarettes, chewing tobacco, and electronic cigarettes.  If you have any questions.  Other tests that may be performed during your second trimester include:  Blood tests that check for: ? Low iron levels (anemia). ? High blood sugar that affects pregnant women (gestational diabetes) between 3124 and 28 weeks. ?  Rh antibodies. This is to check for a protein on red blood cells (Rh factor).  Urine tests to check for infections, diabetes, or protein in the urine.  An ultrasound to confirm the proper growth and development of the baby.  An amniocentesis to check for possible genetic problems.  Fetal screens for spina bifida and Down syndrome.  HIV (human immunodeficiency virus) testing. Routine prenatal testing includes screening for HIV, unless you  choose not to have this test.  Follow these instructions at home: Medicines  Follow your health care provider's instructions regarding medicine use. Specific medicines may be either safe or unsafe to take during pregnancy.  Take a prenatal vitamin that contains at least 600 micrograms (mcg) of folic acid.  If you develop constipation, try taking a stool softener if your health care provider approves. Eating and drinking  Eat a balanced diet that includes fresh fruits and vegetables, whole grains, good sources of protein such as meat, eggs, or tofu, and low-fat dairy. Your health care provider will help you determine the amount of weight gain that is right for you.  Avoid raw meat and uncooked cheese. These carry germs that can cause birth defects in the baby.  If you have low calcium intake from food, talk to your health care provider about whether you should take a daily calcium supplement.  Limit foods that are high in fat and processed sugars, such as fried and sweet foods.  To prevent constipation: ? Drink enough fluid to keep your urine clear or pale yellow. ? Eat foods that are high in fiber, such as fresh fruits and vegetables, whole grains, and beans. Activity  Exercise only as directed by your health care provider. Most women can continue their usual exercise routine during pregnancy. Try to exercise for 30 minutes at least 5 days a week. Stop exercising if you experience uterine contractions.  Avoid heavy lifting, wear low heel shoes, and practice good posture.  A sexual relationship may be continued unless your health care provider directs you otherwise. Relieving pain and discomfort  Wear a good support bra to prevent discomfort from breast tenderness.  Take warm sitz baths to soothe any pain or discomfort caused by hemorrhoids. Use hemorrhoid cream if your health care provider approves.  Rest with your legs elevated if you have leg cramps or low back pain.  If you  develop varicose veins, wear support hose. Elevate your feet for 15 minutes, 3-4 times a day. Limit salt in your diet. Prenatal Care  Write down your questions. Take them to your prenatal visits.  Keep all your prenatal visits as told by your health care provider. This is important. Safety  Wear your seat belt at all times when driving.  Make a list of emergency phone numbers, including numbers for family, friends, the hospital, and police and fire departments. General instructions  Ask your health care provider for a referral to a local prenatal education class. Begin classes no later than the beginning of month 6 of your pregnancy.  Ask for help if you have counseling or nutritional needs during pregnancy. Your health care provider can offer advice or refer you to specialists for help with various needs.  Do not use hot tubs, steam rooms, or saunas.  Do not douche or use tampons or scented sanitary pads.  Do not cross your legs for long periods of time.  Avoid cat litter boxes and soil used by cats. These carry germs that can cause birth defects in the baby  and possibly loss of the fetus by miscarriage or stillbirth.  Avoid all smoking, herbs, alcohol, and unprescribed drugs. Chemicals in these products can affect the formation and growth of the baby.  Do not use any products that contain nicotine or tobacco, such as cigarettes and e-cigarettes. If you need help quitting, ask your health care provider.  Visit your dentist if you have not gone yet during your pregnancy. Use a soft toothbrush to brush your teeth and be gentle when you floss. Contact a health care provider if:  You have dizziness.  You have mild pelvic cramps, pelvic pressure, or nagging pain in the abdominal area.  You have persistent nausea, vomiting, or diarrhea.  You have a bad smelling vaginal discharge.  You have pain when you urinate. Get help right away if:  You have a fever.  You are leaking fluid  from your vagina.  You have spotting or bleeding from your vagina.  You have severe abdominal cramping or pain.  You have rapid weight gain or weight loss.  You have shortness of breath with chest pain.  You notice sudden or extreme swelling of your face, hands, ankles, feet, or legs.  You have not felt your baby move in over an hour.  You have severe headaches that do not go away when you take medicine.  You have vision changes. Summary  The second trimester is from week 14 through week 27 (months 4 through 6). It is also a time when the fetus is growing rapidly.  Your body goes through many changes during pregnancy. The changes vary from woman to woman.  Avoid all smoking, herbs, alcohol, and unprescribed drugs. These chemicals affect the formation and growth your baby.  Do not use any tobacco products, such as cigarettes, chewing tobacco, and e-cigarettes. If you need help quitting, ask your health care provider.  Contact your health care provider if you have any questions. Keep all prenatal visits as told by your health care provider. This is important. This information is not intended to replace advice given to you by your health care provider. Make sure you discuss any questions you have with your health care provider. Document Released: 01/03/2001 Document Revised: 06/17/2015 Document Reviewed: 03/12/2012 Elsevier Interactive Patient Education  2017 [employer]   Breastfeeding Deciding to breastfeed is one of the best choices you can make for you and your baby. A change in hormones during pregnancy causes your breast tissue to grow and increases the number and size of your milk ducts. These hormones also allow proteins, sugars, and fats from your blood supply to make breast milk in your milk-producing glands. Hormones prevent breast milk from being released before your baby is born as well as prompt milk flow after birth. Once breastfeeding has begun, thoughts of your  baby, as well as his or her sucking or crying, can stimulate the release of milk from your milk-producing glands. Benefits of breastfeeding For Your Baby  Your first milk (colostrum) helps your baby's digestive system function better.  There are antibodies in your milk that help your baby fight off infections.  Your baby has a lower incidence of asthma, allergies, and sudden infant death syndrome.  The nutrients in breast milk are better for your baby than infant formulas and are designed uniquely for your baby's needs.  Breast milk improves your baby's brain development.  Your baby is less likely to develop other conditions, such as childhood obesity, asthma, or type 2 diabetes mellitus.  For You  Breastfeeding helps to create a very special bond between you and your baby.  Breastfeeding is convenient. Breast milk is always available at the correct temperature and costs nothing.  Breastfeeding helps to burn calories and helps you lose the weight gained during pregnancy.  Breastfeeding makes your uterus contract to its prepregnancy size faster and slows bleeding (lochia) after you give birth.  Breastfeeding helps to lower your risk of developing type 2 diabetes mellitus, osteoporosis, and breast or ovarian cancer later in life.  Signs that your baby is hungry Early Signs of Hunger  Increased alertness or activity.  Stretching.  Movement of the head from side to side.  Movement of the head and opening of the mouth when the corner of the mouth or cheek is stroked (rooting).  Increased sucking sounds, smacking lips, cooing, sighing, or squeaking.  Hand-to-mouth movements.  Increased sucking of fingers or hands.  Late Signs of Hunger  Fussing.  Intermittent crying.  Extreme Signs of Hunger Signs of extreme hunger will require calming and consoling before your baby will be able to breastfeed successfully. Do not wait for the following signs of extreme hunger to occur  before you initiate breastfeeding:  Restlessness.  A loud, strong cry.  Screaming.  Breastfeeding basics Breastfeeding Initiation  Find a comfortable place to sit or lie down, with your neck and back well supported.  Place a pillow or rolled up blanket under your baby to bring him or her to the level of your breast (if you are seated). Nursing pillows are specially designed to help support your arms and your baby while you breastfeed.  Make sure that your baby's abdomen is facing your abdomen.  Gently massage your breast. With your fingertips, massage from your chest wall toward your nipple in a circular motion. This encourages milk flow. You may need to continue this action during the feeding if your milk flows slowly.  Support your breast with 4 fingers underneath and your thumb above your nipple. Make sure your fingers are well away from your nipple and your baby's mouth.  Stroke your baby's lips gently with your finger or nipple.  When your baby's mouth is open wide enough, quickly bring your baby to your breast, placing your entire nipple and as much of the colored area around your nipple (areola) as possible into your baby's mouth. ? More areola should be visible above your baby's upper lip than below the lower lip. ? Your baby's tongue should be between his or her lower gum and your breast.  Ensure that your baby's mouth is correctly positioned around your nipple (latched). Your baby's lips should create a seal on your breast and be turned out (everted).  It is common for your baby to suck about 2-3 minutes in order to start the flow of breast milk.  Latching Teaching your baby how to latch on to your breast properly is very important. An improper latch can cause nipple pain and decreased milk supply for you and poor weight gain in your baby. Also, if your baby is not latched onto your nipple properly, he or she may swallow some air during feeding. This can make your baby  fussy. Burping your baby when you switch breasts during the feeding can help to get rid of the air. However, teaching your baby to latch on properly is still the best way to prevent fussiness from swallowing air while breastfeeding. Signs that your baby has successfully latched on to your nipple:  Silent tugging or silent  sucking, without causing you pain.  Swallowing heard between every 3-4 sucks.  Muscle movement above and in front of his or her ears while sucking.  Signs that your baby has not successfully latched on to nipple:  Sucking sounds or smacking sounds from your baby while breastfeeding.  Nipple pain.  If you think your baby has not latched on correctly, slip your finger into the corner of your baby's mouth to break the suction and place it between your baby's gums. Attempt breastfeeding initiation again. Signs of Successful Breastfeeding Signs from your baby:  A gradual decrease in the number of sucks or complete cessation of sucking.  Falling asleep.  Relaxation of his or her body.  Retention of a small amount of milk in his or her mouth.  Letting go of your breast by himself or herself.  Signs from you:  Breasts that have increased in firmness, weight, and size 1-3 hours after feeding.  Breasts that are softer immediately after breastfeeding.  Increased milk volume, as well as a change in milk consistency and color by the fifth day of breastfeeding.  Nipples that are not sore, cracked, or bleeding.  Signs That Your Pecola Leisure is Getting Enough Milk  Wetting at least 1-2 diapers during the first 24 hours after birth.  Wetting at least 5-6 diapers every 24 hours for the first week after birth. The urine should be clear or pale yellow by 5 days after birth.  Wetting 6-8 diapers every 24 hours as your baby continues to grow and develop.  At least 3 stools in a 24-hour period by age 39 days. The stool should be soft and yellow.  At least 3 stools in a 24-hour  period by age 102 days. The stool should be seedy and yellow.  No loss of weight greater than 10% of birth weight during the first 62 days of age.  Average weight gain of 4-7 ounces (113-198 g) per week after age 21 days.  Consistent daily weight gain by age 39 days, without weight loss after the age of 2 weeks.  After a feeding, your baby may spit up a small amount. This is common. Breastfeeding frequency and duration Frequent feeding will help you make more milk and can prevent sore nipples and breast engorgement. Breastfeed when you feel the need to reduce the fullness of your breasts or when your baby shows signs of hunger. This is called "breastfeeding on demand." Avoid introducing a pacifier to your baby while you are working to establish breastfeeding (the first 4-6 weeks after your baby is born). After this time you may choose to use a pacifier. Research has shown that pacifier use during the first year of a baby's life decreases the risk of sudden infant death syndrome (SIDS). Allow your baby to feed on each breast as long as he or she wants. Breastfeed until your baby is finished feeding. When your baby unlatches or falls asleep while feeding from the first breast, offer the second breast. Because newborns are often sleepy in the first few weeks of life, you may need to awaken your baby to get him or her to feed. Breastfeeding times will vary from baby to baby. However, the following rules can serve as a guide to help you ensure that your baby is properly fed:  Newborns (babies 80 weeks of age or younger) may breastfeed every 1-3 hours.  Newborns should not go longer than 3 hours during the day or 5 hours during the night without breastfeeding.  You should breastfeed your baby a minimum of 8 times in a 24-hour period until you begin to introduce solid foods to your baby at around 82 months of age.  Breast milk pumping Pumping and storing breast milk allows you to ensure that your baby is  exclusively fed your breast milk, even at times when you are unable to breastfeed. This is especially important if you are going back to work while you are still breastfeeding or when you are not able to be present during feedings. Your lactation consultant can give you guidelines on how long it is safe to store breast milk. A breast pump is a machine that allows you to pump milk from your breast into a sterile bottle. The pumped breast milk can then be stored in a refrigerator or freezer. Some breast pumps are operated by hand, while others use electricity. Ask your lactation consultant which type will work best for you. Breast pumps can be purchased, but some hospitals and breastfeeding support groups lease breast pumps on a monthly basis. A lactation consultant can teach you how to hand express breast milk, if you prefer not to use a pump. Caring for your breasts while you breastfeed Nipples can become dry, cracked, and sore while breastfeeding. The following recommendations can help keep your breasts moisturized and healthy:  Avoid using soap on your nipples.  Wear a supportive bra. Although not required, special nursing bras and tank tops are designed to allow access to your breasts for breastfeeding without taking off your entire bra or top. Avoid wearing underwire-style bras or extremely tight bras.  Air dry your nipples for 3-73minutes after each feeding.  Use only cotton bra pads to absorb leaked breast milk. Leaking of breast milk between feedings is normal.  Use lanolin on your nipples after breastfeeding. Lanolin helps to maintain your skin's normal moisture barrier. If you use pure lanolin, you do not need to wash it off before feeding your baby again. Pure lanolin is not toxic to your baby. You may also hand express a few drops of breast milk and gently massage that milk into your nipples and allow the milk to air dry.  In the first few weeks after giving birth, some women experience  extremely full breasts (engorgement). Engorgement can make your breasts feel heavy, warm, and tender to the touch. Engorgement peaks within 3-5 days after you give birth. The following recommendations can help ease engorgement:  Completely empty your breasts while breastfeeding or pumping. You may want to start by applying warm, moist heat (in the shower or with warm water-soaked hand towels) just before feeding or pumping. This increases circulation and helps the milk flow. If your baby does not completely empty your breasts while breastfeeding, pump any extra milk after he or she is finished.  Wear a snug bra (nursing or regular) or tank top for 1-2 days to signal your body to slightly decrease milk production.  Apply ice packs to your breasts, unless this is too uncomfortable for you.  Make sure that your baby is latched on and positioned properly while breastfeeding.  If engorgement persists after 48 hours of following these recommendations, contact your health care provider or a Advertising copywriter. Overall health care recommendations while breastfeeding  Eat healthy foods. Alternate between meals and snacks, eating 3 of each per day. Because what you eat affects your breast milk, some of the foods may make your baby more irritable than usual. Avoid eating these foods if you are sure that  they are negatively affecting your baby.  Drink milk, fruit juice, and water to satisfy your thirst (about 10 glasses a day).  Rest often, relax, and continue to take your prenatal vitamins to prevent fatigue, stress, and anemia.  Continue breast self-awareness checks.  Avoid chewing and smoking tobacco. Chemicals from cigarettes that pass into breast milk and exposure to secondhand smoke may harm your baby.  Avoid alcohol and drug use, including marijuana. Some medicines that may be harmful to your baby can pass through breast milk. It is important to ask your health care provider before taking any  medicine, including all over-the-counter and prescription medicine as well as vitamin and herbal supplements. It is possible to become pregnant while breastfeeding. If birth control is desired, ask your health care provider about options that will be safe for your baby. Contact a health care provider if:  You feel like you want to stop breastfeeding or have become frustrated with breastfeeding.  You have painful breasts or nipples.  Your nipples are cracked or bleeding.  Your breasts are red, tender, or warm.  You have a swollen area on either breast.  You have a fever or chills.  You have nausea or vomiting.  You have drainage other than breast milk from your nipples.  Your breasts do not become full before feedings by the fifth day after you give birth.  You feel sad and depressed.  Your baby is too sleepy to eat well.  Your baby is having trouble sleeping.  Your baby is wetting less than 3 diapers in a 24-hour period.  Your baby has less than 3 stools in a 24-hour period.  Your baby's skin or the white part of his or her eyes becomes yellow.  Your baby is not gaining weight by 53 days of age. Get help right away if:  Your baby is overly tired (lethargic) and does not want to wake up and feed.  Your baby develops an unexplained fever. This information is not intended to replace advice given to you by your health care provider. Make sure you discuss any questions you have with your health care provider. Document Released: 01/09/2005 Document Revised: 06/23/2015 Document Reviewed: 07/03/2012 Elsevier Interactive Patient Education  2017 [employer].

## 2016-08-18 ENCOUNTER — Ambulatory Visit (HOSPITAL_COMMUNITY): Admission: RE | Admit: 2016-08-18 | Payer: Self-pay | Source: Ambulatory Visit

## 2016-08-18 ENCOUNTER — Other Ambulatory Visit (HOSPITAL_COMMUNITY): Payer: Self-pay

## 2016-08-22 ENCOUNTER — Telehealth: Payer: Self-pay | Admitting: General Practice

## 2016-08-22 NOTE — Telephone Encounter (Signed)
Called was transferred from [facility]. Patient missed NT appt and wants rescheduled but is outside the time frame. Explained to patient that ultrasound cannot be rescheduled as it can only be done in 1st trimester & now she is in her 2nd trimester. Told patient her next ultrasound will be around 19 weeks which will be schedule at her next appt. Patient states well how will I know my baby is okay until my next appt. Patient states she lost her last baby at 34 weeks and it was dead inside her for 3 days and she didn't know. Offered patient to come in for brief nurse visit to hear heart tones. Patient is agreeable. Told patient I will have someone from the front office contacting her with an appt. Patient verbalized understanding & had no questions

## 2016-08-23 ENCOUNTER — Ambulatory Visit: Payer: Self-pay

## 2016-08-30 ENCOUNTER — Inpatient Hospital Stay (HOSPITAL_COMMUNITY)
Admission: AD | Admit: 2016-08-30 | Discharge: 2016-08-30 | Disposition: A | Discharge status: Home / Self Care | Payer: Self-pay | Source: Ambulatory Visit | Attending: Obstetrics and Gynecology | Admitting: Obstetrics and Gynecology

## 2016-08-30 ENCOUNTER — Encounter (HOSPITAL_COMMUNITY): Payer: Self-pay | Admitting: *Deleted

## 2016-08-30 DIAGNOSIS — Z833 Family history of diabetes mellitus: Secondary | ICD-10-CM | POA: Insufficient documentation

## 2016-08-30 DIAGNOSIS — O219 Vomiting of pregnancy, unspecified: Secondary | ICD-10-CM | POA: Insufficient documentation

## 2016-08-30 DIAGNOSIS — O26899 Other specified pregnancy related conditions, unspecified trimester: Secondary | ICD-10-CM

## 2016-08-30 DIAGNOSIS — R1030 Lower abdominal pain, unspecified: Secondary | ICD-10-CM | POA: Insufficient documentation

## 2016-08-30 DIAGNOSIS — Z8249 Family history of ischemic heart disease and other diseases of the circulatory system: Secondary | ICD-10-CM | POA: Insufficient documentation

## 2016-08-30 DIAGNOSIS — Z3A15 15 weeks gestation of pregnancy: Secondary | ICD-10-CM | POA: Insufficient documentation

## 2016-08-30 DIAGNOSIS — F17210 Nicotine dependence, cigarettes, uncomplicated: Secondary | ICD-10-CM | POA: Insufficient documentation

## 2016-08-30 DIAGNOSIS — O26892 Other specified pregnancy related conditions, second trimester: Secondary | ICD-10-CM | POA: Insufficient documentation

## 2016-08-30 DIAGNOSIS — R109 Unspecified abdominal pain: Secondary | ICD-10-CM

## 2016-08-30 DIAGNOSIS — Z3492 Encounter for supervision of normal pregnancy, unspecified, second trimester: Secondary | ICD-10-CM

## 2016-08-30 DIAGNOSIS — Z825 Family history of asthma and other chronic lower respiratory diseases: Secondary | ICD-10-CM | POA: Insufficient documentation

## 2016-08-30 DIAGNOSIS — O99332 Smoking (tobacco) complicating pregnancy, second trimester: Secondary | ICD-10-CM | POA: Insufficient documentation

## 2016-08-30 LAB — URINALYSIS, ROUTINE W REFLEX MICROSCOPIC
Bilirubin Urine: NEGATIVE
Glucose, UA: NEGATIVE mg/dL
HGB URINE DIPSTICK: NEGATIVE
Ketones, ur: NEGATIVE mg/dL
Leukocytes, UA: NEGATIVE
Nitrite: NEGATIVE
PH: 6 (ref 5.0–8.0)
Protein, ur: NEGATIVE mg/dL
SPECIFIC GRAVITY, URINE: 1.023 (ref 1.005–1.030)

## 2016-08-30 MED ORDER — PROMETHAZINE HCL 25 MG PO TABS: 25.0000 mg | ORAL_TABLET | Freq: Four times a day (QID) | ORAL | 0 refills | Status: DC | PRN

## 2016-08-30 NOTE — Discharge Instructions (Signed)

## 2016-08-30 NOTE — MAU Note (Signed)
Pt presents with "sharp" intermittent pain in lower abdomen that began 2 days ago.  Denies vaginal bleeding.

## 2016-08-30 NOTE — MAU Provider Note (Signed)
History     CSN: [id number]  Arrival date and time: 08/30/16 4098  First Provider Initiated Contact with Patient 08/30/16 1001      Chief Complaint  Patient presents with  . Abdominal Pain   HPI Destiny Wu is a 21 y.o. G3P0110 at [redacted]w[redacted]d who presents with abdominal pain & n/v. Symptoms began 3 days ago. Reports intermittent lower abdominal pain that she describes as sharp. Pain worse at night and in the morning. Rates pain 7/10 when it occurs; currently no pain. Has not treated. Denies vaginal bleeding, dysuria, or LOF. Had intercourse yesterday.  Nausea & vomiting x 3 days as well. Vomited 3 times per day. Currently not nauseated. Does not have antiemetic at home. No sick contacts. Denies fever/chills, diarrhea, or constipation.   OB History    Gravida Para Term Preterm AB Living   3 1   1 1  0   SAB TAB Ectopic Multiple Live Births   1              Past Medical History:  Diagnosis Date  . History of IUFD 2014  . UTI (urinary tract infection)     Past Surgical History:  Procedure Laterality Date  . NO PAST SURGERIES      Family History  Problem Relation Age of Onset  . Diabetes Maternal Grandmother   . Asthma Maternal Grandmother   . Hypertension Maternal Grandmother   . Asthma Mother   . Hypertension Mother   . Diabetes Mother   . Asthma Maternal Grandfather   . Diabetes Maternal Grandfather   . Hypertension Maternal Grandfather     Social History  Substance Use Topics  . Smoking status: Current Every Day Smoker    Packs/day: 0.50    Years: 1.00    Types: Cigarettes  . Smokeless tobacco: Never Used  . Alcohol use No    Allergies: No Known Allergies  Prescriptions Prior to Admission  Medication Sig Dispense Refill Last Dose  . Prenatal Multivit-Min-Fe-FA (PRENATAL VITAMINS) 0.8 MG tablet Take 1 tablet by mouth daily. 30 tablet 12 Taking    Review of Systems  Constitutional: Negative.   Gastrointestinal: Positive for abdominal pain (none  currently), nausea (none currently) and vomiting. Negative for constipation and diarrhea.  Genitourinary: Negative.  Negative for dysuria and vaginal bleeding.   Physical Exam   Blood pressure 114/63, pulse 65, temperature 98.3 F (36.8 C), temperature source Oral, resp. rate 16, height 5\' 4"  (1.626 m), weight 176 lb (79.8 kg), last menstrual period 05/16/2016, SpO2 100 %.  Physical Exam  Nursing note and vitals reviewed. Constitutional: She is oriented to person, place, and time. She appears well-developed and well-nourished. No distress.  HENT:  Head: Normocephalic and atraumatic.  Eyes: Conjunctivae are normal. Right eye exhibits no discharge. Left eye exhibits no discharge. No scleral icterus.  Neck: Normal range of motion.  Respiratory: Effort normal. No respiratory distress.  GI: Soft. There is no tenderness.  Neurological: She is alert and oriented to person, place, and time.  Skin: Skin is warm and dry. She is not diaphoretic.  Psychiatric: She has a normal mood and affect. Her behavior is normal. Judgment and thought content normal.   Dilation: Closed Effacement (%): Thick Cervical Position: Posterior Exam by:: Estanislado Spire, NP  MAU Course  Procedures Results for orders placed or performed during the hospital encounter of 08/30/16 (from the past 24 hour(s))  Urinalysis, Routine w reflex microscopic     Status: None   Collection Time:  08/30/16  9:25 AM  Result Value Ref Range   Color, Urine YELLOW YELLOW   APPearance CLEAR CLEAR   Specific Gravity, Urine 1.023 1.005 - 1.030   pH 6.0 5.0 - 8.0   Glucose, UA NEGATIVE NEGATIVE mg/dL   Hgb urine dipstick NEGATIVE NEGATIVE   Bilirubin Urine NEGATIVE NEGATIVE   Ketones, ur NEGATIVE NEGATIVE mg/dL   Protein, ur NEGATIVE NEGATIVE mg/dL   Nitrite NEGATIVE NEGATIVE   Leukocytes, UA NEGATIVE NEGATIVE    MDM FHT 136 Cervix closed/thick U/a negative Offered antiemetic; pt currently not nauseated VSS, NAD  Assessment and  Plan  A: 1. Abdominal pain affecting pregnancy   2. Nausea and vomiting during pregnancy prior to [redacted] weeks gestation   3. Fetal heart tones present, second trimester    P: Discharge home Discussed reasons to return to MAU Rx phenergan Keep f/u with OB Recommend pregnancy support belt  [name] 08/30/2016, 10:00 AM

## 2016-08-30 NOTE — MAU Note (Signed)
Pt reports lower abd pain x 2 days, denies nausea but reports vomiting when she brushes her teeth. Denies bleeding.

## 2016-09-13 ENCOUNTER — Ambulatory Visit (INDEPENDENT_AMBULATORY_CARE_PROVIDER_SITE_OTHER): Payer: Self-pay | Admitting: Obstetrics & Gynecology

## 2016-09-13 VITALS — BP 108/57 | HR 72 | Wt 180.1 lb

## 2016-09-13 DIAGNOSIS — O09293 Supervision of pregnancy with other poor reproductive or obstetric history, third trimester: Secondary | ICD-10-CM

## 2016-09-13 DIAGNOSIS — O0992 Supervision of high risk pregnancy, unspecified, second trimester: Secondary | ICD-10-CM

## 2016-09-13 DIAGNOSIS — O09292 Supervision of pregnancy with other poor reproductive or obstetric history, second trimester: Secondary | ICD-10-CM

## 2016-09-13 DIAGNOSIS — L309 Dermatitis, unspecified: Secondary | ICD-10-CM

## 2016-09-13 MED ORDER — TRIAMCINOLONE ACETONIDE 0.1 % EX CREA: TOPICAL_CREAM | Freq: Two times a day (BID) | CUTANEOUS | Status: DC

## 2016-09-13 NOTE — Progress Notes (Signed)
   PRENATAL VISIT NOTE  Subjective:  Destiny Wu is a 21 y.o. G3P0110 at [redacted]w[redacted]d being seen today for ongoing prenatal care.  She is currently monitored for the following issues for this low-risk pregnancy and has Supervision of high-risk pregnancy; History of stillbirth in currently pregnant patient; Group B streptococcal bacteriuria; Eczema; and Allergic rhinitis on her problem list.  Patient reports no complaints.  Contractions: Not present. Vag. Bleeding: None.  Movement: Present. Denies leaking of fluid.   The following portions of the patient's history were reviewed and updated as appropriate: allergies, current medications, past family history, past medical history, past social history, past surgical history and problem list. Problem list updated.  Objective:   Vitals:   09/13/16 1248  BP: (!) 108/57  Pulse: 72  Weight: 180 lb 1.6 oz (81.7 kg)    Fetal Status: Fetal Heart Rate (bpm): 139 Fundal Height: 18 cm Movement: Present     General:  Alert, oriented and cooperative. Patient is in no acute distress.  Skin: Skin is warm and dry. No rash noted.   Cardiovascular: Normal heart rate noted  Respiratory: Normal respiratory effort, no problems with respiration noted  Abdomen: Soft, gravid, appropriate for gestational age.  Pain/Pressure: Present     Pelvic: Cervical exam deferred        Extremities: Normal range of motion.  Edema: None  Mental Status:  Normal mood and affect. Normal behavior. Normal judgment and thought content.   Assessment and Plan:  Pregnancy: G3P0110 at [redacted]w[redacted]d  1. Supervision of high risk pregnancy in second trimester 2 weeks - [place] MFM OB DETAIL +14 WK; Future  2. History of stillbirth in pregnant patient in third trimester, antepartum  - [place] MFM OB DETAIL +14 WK; Future  3. Eczema, unspecified type  - triamcinolone cream (KENALOG) 0.1 %; Apply topically 2 (two) times daily.  Preterm labor symptoms and general obstetric precautions  including but not limited to vaginal bleeding, contractions, leaking of fluid and fetal movement were reviewed in detail with the patient. Please refer to After Visit Summary for other counseling recommendations.  Return in about 4 weeks (around 10/11/2016).   Scheryl Darter, MD

## 2016-09-13 NOTE — Patient Instructions (Signed)

## 2016-09-27 ENCOUNTER — Encounter (HOSPITAL_COMMUNITY): Payer: Self-pay

## 2016-09-27 ENCOUNTER — Ambulatory Visit (HOSPITAL_COMMUNITY)
Admission: RE | Admit: 2016-09-27 | Discharge: 2016-09-27 | Disposition: A | Discharge status: Home / Self Care | Payer: Self-pay | Source: Ambulatory Visit | Attending: Obstetrics & Gynecology | Admitting: Obstetrics & Gynecology

## 2016-09-27 ENCOUNTER — Other Ambulatory Visit (HOSPITAL_COMMUNITY): Payer: Self-pay | Admitting: *Deleted

## 2016-09-27 DIAGNOSIS — R8271 Bacteriuria: Secondary | ICD-10-CM

## 2016-09-27 DIAGNOSIS — O0992 Supervision of high risk pregnancy, unspecified, second trimester: Secondary | ICD-10-CM | POA: Insufficient documentation

## 2016-09-27 DIAGNOSIS — L309 Dermatitis, unspecified: Secondary | ICD-10-CM

## 2016-09-27 DIAGNOSIS — O09293 Supervision of pregnancy with other poor reproductive or obstetric history, third trimester: Secondary | ICD-10-CM

## 2016-09-27 DIAGNOSIS — Z3A19 19 weeks gestation of pregnancy: Secondary | ICD-10-CM | POA: Insufficient documentation

## 2016-09-27 DIAGNOSIS — Z8759 Personal history of other complications of pregnancy, childbirth and the puerperium: Secondary | ICD-10-CM

## 2016-09-27 DIAGNOSIS — O09292 Supervision of pregnancy with other poor reproductive or obstetric history, second trimester: Secondary | ICD-10-CM | POA: Insufficient documentation

## 2016-10-11 ENCOUNTER — Ambulatory Visit (INDEPENDENT_AMBULATORY_CARE_PROVIDER_SITE_OTHER): Payer: Self-pay | Admitting: Obstetrics and Gynecology

## 2016-10-11 VITALS — BP 114/61 | HR 79 | Wt 180.0 lb

## 2016-10-11 DIAGNOSIS — O0992 Supervision of high risk pregnancy, unspecified, second trimester: Secondary | ICD-10-CM

## 2016-10-11 DIAGNOSIS — R8271 Bacteriuria: Secondary | ICD-10-CM

## 2016-10-11 DIAGNOSIS — O09292 Supervision of pregnancy with other poor reproductive or obstetric history, second trimester: Secondary | ICD-10-CM

## 2016-10-11 DIAGNOSIS — Z23 Encounter for immunization: Secondary | ICD-10-CM

## 2016-10-11 NOTE — Progress Notes (Signed)
Prenatal Visit Note Date: 10/11/2016 Clinic: [facility]  Subjective:  Destiny Wu is a 21 y.o. G3P0110 at [redacted]w[redacted]d being seen today for ongoing prenatal care.  She is currently monitored for the following issues for this high-risk pregnancy and has Supervision of high-risk pregnancy; History of stillbirth in currently pregnant patient; Group B streptococcal bacteriuria; Eczema; and Allergic rhinitis on her problem list.  Patient reports no complaints.   Contractions: Not present. Vag. Bleeding: None.  Movement: Present. Denies leaking of fluid.   The following portions of the patient's history were reviewed and updated as appropriate: allergies, current medications, past family history, past medical history, past social history, past surgical history and problem list. Problem list updated.  Objective:   Vitals:   10/11/16 1416  BP: 114/61  Pulse: 79  Weight: 81.6 kg (180 lb)    Fetal Status: Fetal Heart Rate (bpm): 154   Movement: Present     General:  Alert, oriented and cooperative. Patient is in no acute distress.  Skin: Skin is warm and dry. No rash noted.   Cardiovascular: Normal heart rate noted  Respiratory: Normal respiratory effort, no problems with respiration noted  Abdomen: Soft, gravid, appropriate for gestational age. Pain/Pressure: Absent     Pelvic:  Cervical exam deferred        Extremities: Normal range of motion.  Edema: None  Mental Status: Normal mood and affect. Normal behavior. Normal judgment and thought content.   Urinalysis:      Assessment and Plan:  Pregnancy: G3P0110 at [redacted]w[redacted]d  1. Supervision of high risk pregnancy in second trimester Routine care. Too late for quad screen - Flu Vaccine QUAD 36+ mos IM (Fluarix, Quad PF)  2. History of stillbirth in pregnant patient in second trimester, antepartum Serial growth u/s starting now (already scheduled), start ap testing at 30 - Flu Vaccine QUAD 36+ mos IM (Fluarix, Quad  PF)  3. Group B streptococcal bacteriuria toc nv.   Preterm labor symptoms and general obstetric precautions including but not limited to vaginal bleeding, contractions, leaking of fluid and fetal movement were reviewed in detail with the patient. Please refer to After Visit Summary for other counseling recommendations.  Return in about 3 weeks (around 11/01/2016) for rob.   [place] Bing, MD

## 2016-10-11 NOTE — Patient Instructions (Signed)

## 2016-10-16 ENCOUNTER — Telehealth: Payer: Self-pay | Admitting: General Practice

## 2016-10-16 DIAGNOSIS — L309 Dermatitis, unspecified: Secondary | ICD-10-CM

## 2016-10-16 MED ORDER — TRIAMCINOLONE ACETONIDE 0.1 % EX CREA: 1.0000 "application " | TOPICAL_CREAM | Freq: Two times a day (BID) | CUTANEOUS | 0 refills | Status: DC

## 2016-10-16 NOTE — Telephone Encounter (Signed)
Received fax from [facility] that patient went to pharmacy but medication isn't there. Called patient and she states she is expecting a kenalog Rx. Per chart review, it looks like med was accidentally ordered for inpatient. Med reordered & patient informed. Patient verbalized understanding & had no questions

## 2016-10-25 ENCOUNTER — Encounter (HOSPITAL_COMMUNITY): Payer: Self-pay

## 2016-10-25 ENCOUNTER — Other Ambulatory Visit (HOSPITAL_COMMUNITY): Payer: Self-pay | Admitting: *Deleted

## 2016-10-25 ENCOUNTER — Ambulatory Visit (HOSPITAL_COMMUNITY)
Admission: RE | Admit: 2016-10-25 | Discharge: 2016-10-25 | Disposition: A | Discharge status: Home / Self Care | Payer: Self-pay | Source: Ambulatory Visit | Attending: Obstetrics & Gynecology | Admitting: Obstetrics & Gynecology

## 2016-10-25 DIAGNOSIS — R8271 Bacteriuria: Secondary | ICD-10-CM

## 2016-10-25 DIAGNOSIS — Z3A23 23 weeks gestation of pregnancy: Secondary | ICD-10-CM | POA: Insufficient documentation

## 2016-10-25 DIAGNOSIS — O0992 Supervision of high risk pregnancy, unspecified, second trimester: Secondary | ICD-10-CM

## 2016-10-25 DIAGNOSIS — Z8759 Personal history of other complications of pregnancy, childbirth and the puerperium: Secondary | ICD-10-CM

## 2016-10-25 DIAGNOSIS — O09292 Supervision of pregnancy with other poor reproductive or obstetric history, second trimester: Secondary | ICD-10-CM | POA: Insufficient documentation

## 2016-10-25 DIAGNOSIS — L309 Dermatitis, unspecified: Secondary | ICD-10-CM

## 2016-10-25 DIAGNOSIS — Z362 Encounter for other antenatal screening follow-up: Secondary | ICD-10-CM | POA: Insufficient documentation

## 2016-11-02 ENCOUNTER — Encounter: Payer: Self-pay | Admitting: Obstetrics & Gynecology

## 2016-11-13 ENCOUNTER — Ambulatory Visit (INDEPENDENT_AMBULATORY_CARE_PROVIDER_SITE_OTHER): Payer: Self-pay | Admitting: Obstetrics & Gynecology

## 2016-11-13 VITALS — BP 115/49 | HR 63 | Wt 191.7 lb

## 2016-11-13 DIAGNOSIS — O0990 Supervision of high risk pregnancy, unspecified, unspecified trimester: Secondary | ICD-10-CM

## 2016-11-13 DIAGNOSIS — O09292 Supervision of pregnancy with other poor reproductive or obstetric history, second trimester: Secondary | ICD-10-CM

## 2016-11-13 DIAGNOSIS — O0992 Supervision of high risk pregnancy, unspecified, second trimester: Secondary | ICD-10-CM

## 2016-11-13 MED ORDER — TRIAMCINOLONE ACETONIDE 0.5 % EX OINT: 1.0000 "application " | TOPICAL_OINTMENT | Freq: Two times a day (BID) | CUTANEOUS | 0 refills | Status: DC

## 2016-11-13 NOTE — Patient Instructions (Signed)
Eucerin Cream (buy in bath section of drug store)

## 2016-11-13 NOTE — Progress Notes (Signed)
States when brushes teeth sometimes makes her throw up and some foods make her throw up and sometimes sees streaks of blood in her emesis.     PRENATAL VISIT NOTE  Subjective:  Destiny Wu is a 21 y.o. G3P0110 at 7056w6d being seen today for ongoing prenatal care.  She is currently monitored for the following issues for this high-risk pregnancy and has Supervision of high-risk pregnancy; History of stillbirth in currently pregnant patient; Group B streptococcal bacteriuria; Eczema; and Allergic rhinitis on her problem list.  Patient reports dry hands from eczema and gagging with teeth brushing..  Contractions: Not present. Vag. Bleeding: None.  Movement: Present. Denies leaking of fluid.   The following portions of the patient's history were reviewed and updated as appropriate: allergies, current medications, past family history, past medical history, past social history, past surgical history and problem list. Problem list updated.  Objective:   Vitals:   11/13/16 1602  BP: (!) 115/49  Pulse: 63  Weight: 191 lb 11.2 oz (87 kg)    Fetal Status: Fetal Heart Rate (bpm): 139 Fundal Height: 27 cm Movement: Present     General:  Alert, oriented and cooperative. Patient is in no acute distress.  Skin: Skin is warm and dry. No rash noted.   Cardiovascular: Normal heart rate noted  Respiratory: Normal respiratory effort, no problems with respiration noted  Abdomen: Soft, gravid, appropriate for gestational age.  Pain/Pressure: Present     Pelvic: Cervical exam deferred        Extremities: Normal range of motion.  Edema: None  Mental Status:  Normal mood and affect. Normal behavior. Normal judgment and thought content.   Assessment and Plan:  Pregnancy: G3P0110 at 5456w6d  1. Supervision of high risk pregnancy, antepartum GCT next visit  2. History of stillbirth in pregnant patient in second trimester, antepartum Has serial US scheduled and antenatal testing at 30 weeks  3.   Eczema:  Change to Triamcinolone ointment (has used in the past and works better).  Pt also to use OTC Eucerin cream, use rubber gloves to wash dishes, wear gloves at night.  Preterm labor symptoms and general obstetric precautions including but not limited to vaginal bleeding, contractions, leaking of fluid and fetal movement were reviewed in detail with the patient. Please refer to After Visit Summary for other counseling recommendations.  Return in about 2 weeks (around 11/27/2016).   Elsie LincolnKelly Leggett, MD

## 2016-11-21 ENCOUNTER — Encounter (HOSPITAL_COMMUNITY): Payer: Self-pay

## 2016-11-22 ENCOUNTER — Encounter (HOSPITAL_COMMUNITY): Payer: Self-pay

## 2016-11-22 ENCOUNTER — Ambulatory Visit (HOSPITAL_COMMUNITY)
Admission: RE | Admit: 2016-11-22 | Discharge: 2016-11-22 | Disposition: A | Discharge status: Home / Self Care | Payer: Self-pay | Source: Ambulatory Visit | Attending: Obstetrics & Gynecology | Admitting: Obstetrics & Gynecology

## 2016-11-22 DIAGNOSIS — Z8759 Personal history of other complications of pregnancy, childbirth and the puerperium: Secondary | ICD-10-CM | POA: Insufficient documentation

## 2016-11-22 DIAGNOSIS — R8271 Bacteriuria: Secondary | ICD-10-CM

## 2016-11-22 DIAGNOSIS — O0990 Supervision of high risk pregnancy, unspecified, unspecified trimester: Secondary | ICD-10-CM

## 2016-11-22 DIAGNOSIS — O09292 Supervision of pregnancy with other poor reproductive or obstetric history, second trimester: Secondary | ICD-10-CM

## 2016-11-22 DIAGNOSIS — Z362 Encounter for other antenatal screening follow-up: Secondary | ICD-10-CM | POA: Insufficient documentation

## 2016-11-22 DIAGNOSIS — L309 Dermatitis, unspecified: Secondary | ICD-10-CM

## 2016-11-22 DIAGNOSIS — Z3A27 27 weeks gestation of pregnancy: Secondary | ICD-10-CM | POA: Insufficient documentation

## 2016-11-23 ENCOUNTER — Other Ambulatory Visit (HOSPITAL_COMMUNITY): Payer: Self-pay | Admitting: *Deleted

## 2016-11-23 DIAGNOSIS — O09293 Supervision of pregnancy with other poor reproductive or obstetric history, third trimester: Secondary | ICD-10-CM

## 2016-11-29 ENCOUNTER — Ambulatory Visit (INDEPENDENT_AMBULATORY_CARE_PROVIDER_SITE_OTHER): Payer: Self-pay | Admitting: Advanced Practice Midwife

## 2016-11-29 DIAGNOSIS — Z23 Encounter for immunization: Secondary | ICD-10-CM

## 2016-11-29 DIAGNOSIS — O0993 Supervision of high risk pregnancy, unspecified, third trimester: Secondary | ICD-10-CM

## 2016-11-29 MED ORDER — TETANUS-DIPHTH-ACELL PERTUSSIS 5-2.5-18.5 LF-MCG/0.5 IM SUSP
0.5000 mL | Freq: Once | INTRAMUSCULAR | Status: AC
  Administered 2016-11-29: 0.5 mL via INTRAMUSCULAR

## 2016-11-29 NOTE — Progress Notes (Signed)
   PRENATAL VISIT NOTE  Subjective:  Destiny Wu is a 21 y.o. G3P0110 at 2475w1d being seen today for ongoing prenatal care.  She is currently monitored for the following issues for this high-risk pregnancy and has Supervision of high-risk pregnancy; History of stillbirth in currently pregnant patient; Group B streptococcal bacteriuria; Eczema; and Allergic rhinitis on their problem list.  Patient reports no complaints.  Contractions: Not present. Vag. Bleeding: None.  Movement: Present. Denies leaking of fluid.   The following portions of the patient's history were reviewed and updated as appropriate: allergies, current medications, past family history, past medical history, past social history, past surgical history and problem list. Problem list updated.  Objective:   Vitals:   11/29/16 0810  BP: 108/60  Pulse: 82  Weight: 194 lb (88 kg)    Fetal Status: Fetal Heart Rate (bpm): 148 Fundal Height: 29 cm Movement: Present  Presentation: Vertex  General:  Alert, oriented and cooperative. Patient is in no acute distress.  Skin: Skin is warm and dry. No rash noted.   Cardiovascular: Normal heart rate noted  Respiratory: Normal respiratory effort, no problems with respiration noted  Abdomen: Soft, gravid, appropriate for gestational age.  Pain/Pressure: Absent     Pelvic: Cervical exam deferred        Extremities: Normal range of motion.  Edema: None  Mental Status:  Normal mood and affect. Normal behavior. Normal judgment and thought content.   Assessment and Plan:  Pregnancy: G3P0110 at 7075w1d  There are no diagnoses linked to this encounter. Preterm labor symptoms and general obstetric precautions including but not limited to vaginal bleeding, contractions, leaking of fluid and fetal movement were reviewed in detail with the patient. Please refer to After Visit Summary for other counseling recommendations.  Return in about 2 weeks (around 12/13/2016) for ROB, start  antenatal testing (at 30 weeks per MFM).   Dorathy KinsmanVirginia Lacharles Altschuler, CNM

## 2016-11-29 NOTE — Addendum Note (Signed)
Addended by: Gerome ApleyZEYFANG, Caliegh Middlekauff L on: 11/29/2016 08:52 AM   Modules accepted: Orders

## 2016-11-29 NOTE — Patient Instructions (Signed)
AREA PEDIATRIC/FAMILY PRACTICE PHYSICIANS  [facility] [address], [address] [place], [place]  [address] Phone - [phone]   Fax - [phone]  [facility] [address] [place], [place] [address] Phone - [phone]   Fax - [phone]  [facility] [address] [place], [place]  [address] Phone - [phone]   Fax - [phone]  [facility] [address] [place], [place]  [address] Phone - [phone]   Fax - [phone]  [facility] [address] [place], [place]  [address] Phone - [phone]   Fax - [phone]  [facility] [address], [address] [place], [place]  [address] Phone - [phone]   Fax - [phone]  [facility] [address], [address] [place], [place]  [address] Phone - [phone]   Fax - [phone]  [facility] [address], [address] [place], [place]  [address] Phone - [phone]   Fax - [phone]  [facility] [address] [place], [place]  [address] Phone - [phone]   Fax - [phone] [facility] [address] [place], [place]  [address] Phone - [phone]   Fax - [phone]  [facility] [address] [place], [place]  [address] Phone - [phone]   Fax - [phone]  [facility] [address] [place], [place]  [address] Phone - [phone]   Fax - [phone]  [facility] [address], [address] [place], [place]  [address] Phone - [phone]   Fax - [phone]  SHILPA GOSRANI [address] [place], [place]  [address] Phone - [phone]  [facility] [address] [place], [place]  [address] Phone - [phone]   Fax - [phone]  [facility] [address] [place], [place]  [address] Phone - [phone]   Fax - [phone]  [facility] [address] [place], [place]  [address] Phone - [phone]   Fax - [phone]  [facility] [address] [place], [place]  [address] Phone - [phone]   Fax - [phone]   [facility] [address] [place], [place]  [address] Phone - [phone]   Fax - [phone]  [facility] [address] [place], [place]  [address] Phone - [phone]   Fax - [phone]  DAVID RUBIN [address] [place], [place]  [address] Phone - [phone]   Fax - [phone]  [facility] [address] [place], [place]  [address] Phone - [phone]   Fax - [phone]  [facility] [address] , [place]  [address] Phone - [phone]   Fax - [phone] [facility] [address] [place], [place]  [address] Phone - [phone]   Fax - [phone]  [facility] [address] [place], [place]  [address] Phone - [phone]   Fax - [phone]   [facility] [address] [place], [place]  [address] Phone - [phone]   Fax - [phone]  [facility] Charlene Flemming MD [address] [place] [address] Phone [phone]  Fax [phone]  Contraception Choices Contraception (birth control) is the use of any methods or devices to prevent   pregnancy. Below are some methods to help avoid pregnancy. Hormonal methods  Contraceptive implant. This is a thin, plastic tube containing progesterone hormone. It does not contain estrogen hormone. Your health care provider inserts the tube in the inner part of the upper arm. The tube can remain in place for up to 3 years. After 3 years, the implant must be removed. The implant prevents the  ovaries from releasing an egg (ovulation), thickens the cervical mucus to prevent sperm from entering the uterus, and thins the lining of the inside of the uterus.  Progesterone-only injections. These injections are given every 3 months by your health care provider to prevent pregnancy. This synthetic progesterone hormone stops the ovaries from releasing eggs. It also thickens cervical mucus and changes the uterine lining. This makes it harder for sperm to survive in the uterus.  Birth control pills. These pills contain estrogen and progesterone hormone. They work by preventing the ovaries from releasing eggs (ovulation). They also cause the cervical mucus to thicken, preventing the sperm from entering the uterus. Birth control pills are prescribed by a health care provider.Birth control pills can also be used to treat heavy periods.  Minipill. This type of birth control pill contains only the progesterone hormone. They are taken every day of each month and must be prescribed by your health care provider.  Birth control patch. The patch contains hormones similar to those in birth control pills. It must be changed once a week and is prescribed by a health care provider.  Vaginal ring. The ring contains hormones similar to those in birth control pills. It is left in the vagina for 3 weeks, removed for 1 week, and then a new one is put back in place. The patient must be comfortable inserting and removing the ring from the vagina.A health care provider's prescription is necessary.  Emergency contraception. Emergency contraceptives prevent pregnancy after unprotected sexual intercourse. This pill can be taken right after sex or up to 5 days after unprotected sex. It is most effective the sooner you take the pills after having sexual intercourse. Most emergency contraceptive pills are available without a prescription. Check with your pharmacist. Do not use emergency contraception as your only form of birth  control. Barrier methods  Female condom. This is a thin sheath (latex or rubber) that is worn over the penis during sexual intercourse. It can be used with spermicide to increase effectiveness.  Female condom. This is a soft, loose-fitting sheath that is put into the vagina before sexual intercourse.  Diaphragm. This is a soft, latex, dome-shaped barrier that must be fitted by a health care provider. It is inserted into the vagina, along with a spermicidal jelly. It is inserted before intercourse. The diaphragm should be left in the vagina for 6 to 8 hours after intercourse.  Cervical cap. This is a round, soft, latex or plastic cup that fits over the cervix and must be fitted by a health care provider. The cap can be left in place for up to 48 hours after intercourse.  Sponge. This is a soft, circular piece of polyurethane foam. The sponge has spermicide in it. It is inserted into the vagina after wetting it and before sexual intercourse.  Spermicides. These are chemicals that kill or block sperm from entering the cervix and uterus. They come in the form of creams, jellies, suppositories, foam, or tablets. They do not require a prescription. They are inserted into the vagina with an applicator before having sexual intercourse. The process   must be repeated every time you have sexual intercourse. Intrauterine contraception  Intrauterine device (IUD). This is a T-shaped device that is put in a woman's uterus during a menstrual period to prevent pregnancy. There are 2 types: ? Copper IUD. This type of IUD is wrapped in copper wire and is placed inside the uterus. Copper makes the uterus and fallopian tubes produce a fluid that kills sperm. It can stay in place for 10 years. ? Hormone IUD. This type of IUD contains the hormone progestin (synthetic progesterone). The hormone thickens the cervical mucus and prevents sperm from entering the uterus, and it also thins the uterine lining to prevent  implantation of a fertilized egg. The hormone can weaken or kill the sperm that get into the uterus. It can stay in place for 3-5 years, depending on which type of IUD is used. Permanent methods of contraception  Female tubal ligation. This is when the woman's fallopian tubes are surgically sealed, tied, or blocked to prevent the egg from traveling to the uterus.  Hysteroscopic sterilization. This involves placing a small coil or insert into each fallopian tube. Your doctor uses a technique called hysteroscopy to do the procedure. The device causes scar tissue to form. This results in permanent blockage of the fallopian tubes, so the sperm cannot fertilize the egg. It takes about 3 months after the procedure for the tubes to become blocked. You must use another form of birth control for these 3 months.  Female sterilization. This is when the female has the tubes that carry sperm tied off (vasectomy).This blocks sperm from entering the vagina during sexual intercourse. After the procedure, the man can still ejaculate fluid (semen). Natural planning methods  Natural family planning. This is not having sexual intercourse or using a barrier method (condom, diaphragm, cervical cap) on days the woman could become pregnant.  Calendar method. This is keeping track of the length of each menstrual cycle and identifying when you are fertile.  Ovulation method. This is avoiding sexual intercourse during ovulation.  Symptothermal method. This is avoiding sexual intercourse during ovulation, using a thermometer and ovulation symptoms.  Post-ovulation method. This is timing sexual intercourse after you have ovulated. Regardless of which type or method of contraception you choose, it is important that you use condoms to protect against the transmission of sexually transmitted infections (STIs). Talk with your health care provider about which form of contraception is most appropriate for you. This information is not  intended to replace advice given to you by your health care provider. Make sure you discuss any questions you have with your health care provider. Document Released: 01/09/2005 Document Revised: 06/17/2015 Document Reviewed: 07/04/2012 Elsevier Interactive Patient Education  2017 Elsevier Inc.  

## 2016-11-30 LAB — RPR: RPR Ser Ql: NONREACTIVE

## 2016-11-30 LAB — CBC
HEMATOCRIT: 32.1 % — AB (ref 34.0–46.6)
HEMOGLOBIN: 11.3 g/dL (ref 11.1–15.9)
MCH: 32.5 pg (ref 26.6–33.0)
MCHC: 35.2 g/dL (ref 31.5–35.7)
MCV: 92 fL (ref 79–97)
Platelets: 225 10*3/uL (ref 150–379)
RBC: 3.48 x10E6/uL — AB (ref 3.77–5.28)
RDW: 13.8 % (ref 12.3–15.4)
WBC: 9.8 10*3/uL (ref 3.4–10.8)

## 2016-11-30 LAB — HIV ANTIBODY (ROUTINE TESTING W REFLEX): HIV SCREEN 4TH GENERATION: NONREACTIVE

## 2016-11-30 LAB — GLUCOSE TOLERANCE, 2 HOURS W/ 1HR
GLUCOSE, 2 HOUR: 94 mg/dL (ref 65–152)
GLUCOSE, FASTING: 86 mg/dL (ref 65–91)
Glucose, 1 hour: 94 mg/dL (ref 65–179)

## 2016-12-12 ENCOUNTER — Encounter (HOSPITAL_COMMUNITY): Payer: Self-pay

## 2016-12-12 ENCOUNTER — Other Ambulatory Visit (HOSPITAL_COMMUNITY): Payer: Self-pay | Admitting: *Deleted

## 2016-12-12 ENCOUNTER — Encounter: Payer: Self-pay | Admitting: Family Medicine

## 2016-12-12 ENCOUNTER — Ambulatory Visit (INDEPENDENT_AMBULATORY_CARE_PROVIDER_SITE_OTHER): Payer: Self-pay | Admitting: Family Medicine

## 2016-12-12 ENCOUNTER — Other Ambulatory Visit: Payer: Self-pay | Admitting: Family Medicine

## 2016-12-12 ENCOUNTER — Ambulatory Visit (HOSPITAL_COMMUNITY): Admission: RE | Admit: 2016-12-12 | Payer: Medicaid Other | Source: Ambulatory Visit

## 2016-12-12 ENCOUNTER — Ambulatory Visit (HOSPITAL_COMMUNITY)
Admission: RE | Admit: 2016-12-12 | Discharge: 2016-12-12 | Disposition: A | Discharge status: Home / Self Care | Payer: Medicaid Other | Source: Ambulatory Visit | Attending: Family Medicine | Admitting: Family Medicine

## 2016-12-12 VITALS — BP 103/64 | HR 66 | Wt 194.0 lb

## 2016-12-12 DIAGNOSIS — Z362 Encounter for other antenatal screening follow-up: Secondary | ICD-10-CM | POA: Diagnosis not present

## 2016-12-12 DIAGNOSIS — Z8759 Personal history of other complications of pregnancy, childbirth and the puerperium: Secondary | ICD-10-CM

## 2016-12-12 DIAGNOSIS — O09299 Supervision of pregnancy with other poor reproductive or obstetric history, unspecified trimester: Secondary | ICD-10-CM

## 2016-12-12 DIAGNOSIS — R8271 Bacteriuria: Secondary | ICD-10-CM

## 2016-12-12 DIAGNOSIS — O0993 Supervision of high risk pregnancy, unspecified, third trimester: Secondary | ICD-10-CM

## 2016-12-12 DIAGNOSIS — Z3A30 30 weeks gestation of pregnancy: Secondary | ICD-10-CM

## 2016-12-12 DIAGNOSIS — O09292 Supervision of pregnancy with other poor reproductive or obstetric history, second trimester: Secondary | ICD-10-CM

## 2016-12-12 DIAGNOSIS — L309 Dermatitis, unspecified: Secondary | ICD-10-CM

## 2016-12-12 NOTE — Progress Notes (Signed)
   PRENATAL VISIT NOTE  Subjective:  Destiny Wu is a 21 y.o. G3P0110 at 7022w0d being seen today for ongoing prenatal care.  She is currently monitored for the following issues for this high-risk pregnancy and has Supervision of high-risk pregnancy; History of stillbirth in currently pregnant patient; Group B streptococcal bacteriuria; Eczema; and Allergic rhinitis on their problem list.  Patient reports no complaints.  Contractions: Not present. Vag. Bleeding: None.  Movement: Present. Denies leaking of fluid.   The following portions of the patient's history were reviewed and updated as appropriate: allergies, current medications, past family history, past medical history, past social history, past surgical history and problem list. Problem list updated.  Objective:   Vitals:   12/12/16 1106  BP: 103/64  Pulse: 66  Weight: 194 lb (88 kg)    Fetal Status: Fetal Heart Rate (bpm): 130   Movement: Present     General:  Alert, oriented and cooperative. Patient is in no acute distress.  Skin: Skin is warm and dry. No rash noted.   Cardiovascular: Normal heart rate noted  Respiratory: Normal respiratory effort, no problems with respiration noted  Abdomen: Soft, gravid, appropriate for gestational age.  Pain/Pressure: Present     Pelvic: Cervical exam deferred        Extremities: Normal range of motion.  Edema: None  Mental Status:  Normal mood and affect. Normal behavior. Normal judgment and thought content.   Assessment and Plan:  Pregnancy: G3P0110 at 6822w0d  1. Supervision of high risk pregnancy in third trimester - 28 week labs reviewed, wnl  2. History of IUFD - Start antenatal today. Unable to do here in clinic today. Will do BPP this afternoon - US MFM FETAL BPP W/NONSTRESS; Future - Growth U/S scheduled for 11/18  3. Group B streptococcal bacteriuria:  - Will need IAP   Preterm labor symptoms and general obstetric precautions including but not limited to  vaginal bleeding, contractions, leaking of fluid and fetal movement were reviewed in detail with the patient. Please refer to After Visit Summary for other counseling recommendations.    Frederik PearJulie P Kaislyn Gulas, MD   Future Appointments  Date Time Provider Department Center  12/12/2016  1:45 PM WH-MFC US 2 WH-MFCUS MFC-US  12/12/2016  3:15 PM [facility]-MFC NST WH-MFC MFC-US  12/20/2016  1:30 PM [facility]-MFC US 1 WH-MFCUS MFC-US  12/27/2016 12:40 PM Levie HeritageStinson, Jacob J, DO [facility] WOC  01/10/2017 12:40 PM Bowman BingPickens, Charlie, MD [facility] WOC  01/17/2017 12:40 PM Conan Bowensavis, Kelly M, MD [facility] WOC  01/17/2017  1:30 PM WH-MFC US 1 WH-MFCUS MFC-US

## 2016-12-12 NOTE — Addendum Note (Signed)
Addended by: Lorelle GibbsWILSON, CHIQUITA L on: 12/12/2016 02:02 PM   Modules accepted: Orders

## 2016-12-13 ENCOUNTER — Other Ambulatory Visit (HOSPITAL_COMMUNITY): Payer: Self-pay | Admitting: *Deleted

## 2016-12-13 DIAGNOSIS — Z8759 Personal history of other complications of pregnancy, childbirth and the puerperium: Secondary | ICD-10-CM

## 2016-12-15 ENCOUNTER — Encounter (HOSPITAL_COMMUNITY): Payer: Self-pay

## 2016-12-15 ENCOUNTER — Inpatient Hospital Stay (HOSPITAL_COMMUNITY)
Admission: RE | Admit: 2016-12-15 | Discharge: 2016-12-15 | Disposition: A | Discharge status: Home / Self Care | Payer: Self-pay | Source: Ambulatory Visit

## 2016-12-18 ENCOUNTER — Ambulatory Visit (HOSPITAL_COMMUNITY)
Admission: RE | Admit: 2016-12-18 | Discharge: 2016-12-18 | Disposition: A | Discharge status: Home / Self Care | Payer: Medicaid Other | Source: Ambulatory Visit | Attending: Family Medicine | Admitting: Family Medicine

## 2016-12-18 ENCOUNTER — Other Ambulatory Visit (HOSPITAL_COMMUNITY): Payer: Self-pay | Admitting: Obstetrics and Gynecology

## 2016-12-18 ENCOUNTER — Encounter (HOSPITAL_COMMUNITY): Payer: Self-pay

## 2016-12-18 DIAGNOSIS — O09299 Supervision of pregnancy with other poor reproductive or obstetric history, unspecified trimester: Secondary | ICD-10-CM | POA: Diagnosis present

## 2016-12-18 DIAGNOSIS — Z3A30 30 weeks gestation of pregnancy: Secondary | ICD-10-CM | POA: Diagnosis not present

## 2016-12-19 ENCOUNTER — Other Ambulatory Visit (HOSPITAL_COMMUNITY): Payer: Self-pay

## 2016-12-20 ENCOUNTER — Ambulatory Visit (HOSPITAL_COMMUNITY): Payer: Self-pay

## 2016-12-20 ENCOUNTER — Encounter (HOSPITAL_COMMUNITY): Payer: Self-pay

## 2016-12-21 ENCOUNTER — Encounter (HOSPITAL_COMMUNITY): Payer: Self-pay

## 2016-12-21 ENCOUNTER — Other Ambulatory Visit (HOSPITAL_COMMUNITY): Payer: Self-pay | Admitting: Obstetrics and Gynecology

## 2016-12-21 ENCOUNTER — Other Ambulatory Visit (HOSPITAL_COMMUNITY): Payer: Self-pay | Admitting: Maternal & Fetal Medicine

## 2016-12-21 ENCOUNTER — Ambulatory Visit (HOSPITAL_COMMUNITY)
Admission: RE | Admit: 2016-12-21 | Discharge: 2016-12-21 | Disposition: A | Discharge status: Home / Self Care | Payer: Medicaid Other | Source: Ambulatory Visit | Attending: Maternal & Fetal Medicine | Admitting: Maternal & Fetal Medicine

## 2016-12-21 DIAGNOSIS — O09299 Supervision of pregnancy with other poor reproductive or obstetric history, unspecified trimester: Secondary | ICD-10-CM

## 2016-12-21 DIAGNOSIS — O358XX0 Maternal care for other (suspected) fetal abnormality and damage, not applicable or unspecified: Secondary | ICD-10-CM

## 2016-12-21 DIAGNOSIS — Z3A31 31 weeks gestation of pregnancy: Secondary | ICD-10-CM

## 2016-12-21 DIAGNOSIS — R8271 Bacteriuria: Secondary | ICD-10-CM

## 2016-12-21 DIAGNOSIS — O09293 Supervision of pregnancy with other poor reproductive or obstetric history, third trimester: Secondary | ICD-10-CM | POA: Diagnosis present

## 2016-12-21 DIAGNOSIS — O09292 Supervision of pregnancy with other poor reproductive or obstetric history, second trimester: Secondary | ICD-10-CM

## 2016-12-21 DIAGNOSIS — L309 Dermatitis, unspecified: Secondary | ICD-10-CM

## 2016-12-21 DIAGNOSIS — O0993 Supervision of high risk pregnancy, unspecified, third trimester: Secondary | ICD-10-CM

## 2016-12-22 ENCOUNTER — Other Ambulatory Visit (HOSPITAL_COMMUNITY): Payer: Self-pay

## 2016-12-26 ENCOUNTER — Ambulatory Visit (HOSPITAL_COMMUNITY)
Admission: RE | Admit: 2016-12-26 | Discharge: 2016-12-26 | Disposition: A | Discharge status: Home / Self Care | Payer: Medicaid Other | Source: Ambulatory Visit | Attending: Family Medicine | Admitting: Family Medicine

## 2016-12-26 ENCOUNTER — Encounter (HOSPITAL_COMMUNITY): Payer: Self-pay

## 2016-12-26 DIAGNOSIS — O0993 Supervision of high risk pregnancy, unspecified, third trimester: Secondary | ICD-10-CM

## 2016-12-26 DIAGNOSIS — O09293 Supervision of pregnancy with other poor reproductive or obstetric history, third trimester: Secondary | ICD-10-CM | POA: Diagnosis not present

## 2016-12-26 DIAGNOSIS — Z3A32 32 weeks gestation of pregnancy: Secondary | ICD-10-CM | POA: Insufficient documentation

## 2016-12-26 DIAGNOSIS — L309 Dermatitis, unspecified: Secondary | ICD-10-CM

## 2016-12-26 DIAGNOSIS — R8271 Bacteriuria: Secondary | ICD-10-CM

## 2016-12-26 DIAGNOSIS — O09292 Supervision of pregnancy with other poor reproductive or obstetric history, second trimester: Secondary | ICD-10-CM

## 2016-12-26 DIAGNOSIS — O09299 Supervision of pregnancy with other poor reproductive or obstetric history, unspecified trimester: Secondary | ICD-10-CM | POA: Diagnosis present

## 2016-12-27 ENCOUNTER — Encounter: Payer: Self-pay | Admitting: Family Medicine

## 2016-12-29 ENCOUNTER — Other Ambulatory Visit (HOSPITAL_COMMUNITY): Payer: Self-pay | Admitting: *Deleted

## 2016-12-29 ENCOUNTER — Encounter (HOSPITAL_COMMUNITY): Payer: Self-pay

## 2016-12-29 ENCOUNTER — Ambulatory Visit (HOSPITAL_COMMUNITY)
Admission: RE | Admit: 2016-12-29 | Discharge: 2016-12-29 | Disposition: A | Discharge status: Home / Self Care | Payer: Medicaid Other | Source: Ambulatory Visit | Attending: Family Medicine | Admitting: Family Medicine

## 2016-12-29 DIAGNOSIS — Z3A32 32 weeks gestation of pregnancy: Secondary | ICD-10-CM | POA: Diagnosis not present

## 2016-12-29 DIAGNOSIS — O09293 Supervision of pregnancy with other poor reproductive or obstetric history, third trimester: Secondary | ICD-10-CM | POA: Diagnosis not present

## 2016-12-29 DIAGNOSIS — O358XX0 Maternal care for other (suspected) fetal abnormality and damage, not applicable or unspecified: Secondary | ICD-10-CM

## 2016-12-29 DIAGNOSIS — O09299 Supervision of pregnancy with other poor reproductive or obstetric history, unspecified trimester: Secondary | ICD-10-CM

## 2016-12-29 NOTE — Addendum Note (Signed)
Encounter addended by: Lenoard AdenJohnson, Kahley Leib M, RDMS on: 12/29/2016 11:34 AM  Actions taken: Imaging Exam ended

## 2017-01-05 ENCOUNTER — Ambulatory Visit (HOSPITAL_COMMUNITY)
Admission: RE | Admit: 2017-01-05 | Discharge: 2017-01-05 | Disposition: A | Discharge status: Home / Self Care | Payer: Medicaid Other | Source: Ambulatory Visit | Attending: Family Medicine | Admitting: Family Medicine

## 2017-01-05 ENCOUNTER — Other Ambulatory Visit (HOSPITAL_COMMUNITY): Payer: Self-pay | Admitting: Obstetrics and Gynecology

## 2017-01-05 ENCOUNTER — Encounter (HOSPITAL_COMMUNITY): Payer: Self-pay

## 2017-01-05 DIAGNOSIS — O09299 Supervision of pregnancy with other poor reproductive or obstetric history, unspecified trimester: Secondary | ICD-10-CM

## 2017-01-05 DIAGNOSIS — R8271 Bacteriuria: Secondary | ICD-10-CM

## 2017-01-05 DIAGNOSIS — Z3A33 33 weeks gestation of pregnancy: Secondary | ICD-10-CM | POA: Insufficient documentation

## 2017-01-05 DIAGNOSIS — L309 Dermatitis, unspecified: Secondary | ICD-10-CM

## 2017-01-05 DIAGNOSIS — O09292 Supervision of pregnancy with other poor reproductive or obstetric history, second trimester: Secondary | ICD-10-CM

## 2017-01-05 DIAGNOSIS — O09293 Supervision of pregnancy with other poor reproductive or obstetric history, third trimester: Secondary | ICD-10-CM | POA: Diagnosis not present

## 2017-01-05 DIAGNOSIS — O0993 Supervision of high risk pregnancy, unspecified, third trimester: Secondary | ICD-10-CM

## 2017-01-08 ENCOUNTER — Other Ambulatory Visit (HOSPITAL_COMMUNITY): Payer: Self-pay | Admitting: *Deleted

## 2017-01-08 DIAGNOSIS — O358XX0 Maternal care for other (suspected) fetal abnormality and damage, not applicable or unspecified: Secondary | ICD-10-CM

## 2017-01-09 ENCOUNTER — Other Ambulatory Visit (HOSPITAL_COMMUNITY): Payer: Self-pay | Admitting: Obstetrics and Gynecology

## 2017-01-09 ENCOUNTER — Encounter (HOSPITAL_COMMUNITY): Payer: Self-pay

## 2017-01-09 ENCOUNTER — Ambulatory Visit (HOSPITAL_COMMUNITY)
Admission: RE | Admit: 2017-01-09 | Discharge: 2017-01-09 | Disposition: A | Discharge status: Home / Self Care | Payer: Medicaid Other | Source: Ambulatory Visit | Attending: Family Medicine | Admitting: Family Medicine

## 2017-01-09 DIAGNOSIS — Z3A34 34 weeks gestation of pregnancy: Secondary | ICD-10-CM | POA: Diagnosis not present

## 2017-01-09 DIAGNOSIS — L309 Dermatitis, unspecified: Secondary | ICD-10-CM

## 2017-01-09 DIAGNOSIS — O358XX0 Maternal care for other (suspected) fetal abnormality and damage, not applicable or unspecified: Secondary | ICD-10-CM

## 2017-01-09 DIAGNOSIS — O09299 Supervision of pregnancy with other poor reproductive or obstetric history, unspecified trimester: Secondary | ICD-10-CM

## 2017-01-09 DIAGNOSIS — O0993 Supervision of high risk pregnancy, unspecified, third trimester: Secondary | ICD-10-CM

## 2017-01-09 DIAGNOSIS — R8271 Bacteriuria: Secondary | ICD-10-CM

## 2017-01-09 DIAGNOSIS — O09292 Supervision of pregnancy with other poor reproductive or obstetric history, second trimester: Secondary | ICD-10-CM

## 2017-01-10 ENCOUNTER — Encounter: Payer: Self-pay | Admitting: Obstetrics and Gynecology

## 2017-01-11 ENCOUNTER — Encounter (HOSPITAL_COMMUNITY): Payer: Self-pay

## 2017-01-11 ENCOUNTER — Ambulatory Visit (HOSPITAL_COMMUNITY)
Admission: RE | Admit: 2017-01-11 | Discharge: 2017-01-11 | Disposition: A | Discharge status: Home / Self Care | Payer: Medicaid Other | Source: Ambulatory Visit | Attending: Family Medicine | Admitting: Family Medicine

## 2017-01-11 ENCOUNTER — Other Ambulatory Visit (HOSPITAL_COMMUNITY): Payer: Self-pay | Admitting: Obstetrics and Gynecology

## 2017-01-11 DIAGNOSIS — L309 Dermatitis, unspecified: Secondary | ICD-10-CM

## 2017-01-11 DIAGNOSIS — Z8759 Personal history of other complications of pregnancy, childbirth and the puerperium: Secondary | ICD-10-CM

## 2017-01-11 DIAGNOSIS — O0993 Supervision of high risk pregnancy, unspecified, third trimester: Secondary | ICD-10-CM

## 2017-01-11 DIAGNOSIS — R8271 Bacteriuria: Secondary | ICD-10-CM

## 2017-01-11 DIAGNOSIS — Z3A34 34 weeks gestation of pregnancy: Secondary | ICD-10-CM | POA: Diagnosis not present

## 2017-01-11 DIAGNOSIS — O358XX0 Maternal care for other (suspected) fetal abnormality and damage, not applicable or unspecified: Secondary | ICD-10-CM | POA: Diagnosis present

## 2017-01-11 DIAGNOSIS — O09292 Supervision of pregnancy with other poor reproductive or obstetric history, second trimester: Secondary | ICD-10-CM

## 2017-01-11 DIAGNOSIS — O09293 Supervision of pregnancy with other poor reproductive or obstetric history, third trimester: Secondary | ICD-10-CM | POA: Insufficient documentation

## 2017-01-15 ENCOUNTER — Ambulatory Visit (HOSPITAL_COMMUNITY)
Admission: RE | Admit: 2017-01-15 | Discharge: 2017-01-15 | Disposition: A | Discharge status: Home / Self Care | Payer: Self-pay | Source: Ambulatory Visit | Attending: Family Medicine | Admitting: Family Medicine

## 2017-01-17 ENCOUNTER — Ambulatory Visit (INDEPENDENT_AMBULATORY_CARE_PROVIDER_SITE_OTHER): Payer: Self-pay | Admitting: Obstetrics and Gynecology

## 2017-01-17 ENCOUNTER — Other Ambulatory Visit (HOSPITAL_COMMUNITY): Payer: Self-pay | Admitting: Maternal & Fetal Medicine

## 2017-01-17 ENCOUNTER — Encounter: Payer: Self-pay | Admitting: Obstetrics and Gynecology

## 2017-01-17 ENCOUNTER — Encounter (HOSPITAL_COMMUNITY): Payer: Self-pay

## 2017-01-17 ENCOUNTER — Ambulatory Visit (HOSPITAL_COMMUNITY)
Admission: RE | Admit: 2017-01-17 | Discharge: 2017-01-17 | Disposition: A | Discharge status: Home / Self Care | Payer: Medicaid Other | Source: Ambulatory Visit | Attending: Family Medicine | Admitting: Family Medicine

## 2017-01-17 VITALS — BP 106/71 | HR 69

## 2017-01-17 DIAGNOSIS — Z362 Encounter for other antenatal screening follow-up: Secondary | ICD-10-CM | POA: Insufficient documentation

## 2017-01-17 DIAGNOSIS — O358XX0 Maternal care for other (suspected) fetal abnormality and damage, not applicable or unspecified: Secondary | ICD-10-CM

## 2017-01-17 DIAGNOSIS — Z3A35 35 weeks gestation of pregnancy: Secondary | ICD-10-CM | POA: Diagnosis not present

## 2017-01-17 DIAGNOSIS — O09293 Supervision of pregnancy with other poor reproductive or obstetric history, third trimester: Secondary | ICD-10-CM

## 2017-01-17 DIAGNOSIS — R8271 Bacteriuria: Secondary | ICD-10-CM

## 2017-01-17 DIAGNOSIS — O0993 Supervision of high risk pregnancy, unspecified, third trimester: Secondary | ICD-10-CM

## 2017-01-17 NOTE — Patient Instructions (Signed)
Contraception Choices Contraception, also called birth control, refers to methods or devices that prevent pregnancy. Hormonal methods Contraceptive implant A contraceptive implant is a thin, plastic tube that contains a hormone. It is inserted into the upper part of the arm. It can remain in place for up to 3 years. Progestin-only injections Progestin-only injections are injections of progestin, a synthetic form of the hormone progesterone. They are given every 3 months by a health care provider. Birth control pills Birth control pills are pills that contain hormones that prevent pregnancy. They must be taken once a day, preferably at the same time each day. Birth control patch The birth control patch contains hormones that prevent pregnancy. It is placed on the skin and must be changed once a week for three weeks and removed on the fourth week. A prescription is needed to use this method of contraception. Vaginal ring A vaginal ring contains hormones that prevent pregnancy. It is placed in the vagina for three weeks and removed on the fourth week. After that, the process is repeated with a new ring. A prescription is needed to use this method of contraception. Emergency contraceptive Emergency contraceptives prevent pregnancy after unprotected sex. They come in pill form and can be taken up to 5 days after sex. They work best the sooner they are taken after having sex. Most emergency contraceptives are available without a prescription. This method should not be used as your only form of birth control. Barrier methods Female condom A female condom is a thin sheath that is worn over the penis during sex. Condoms keep sperm from going inside a woman's body. They can be used with a spermicide to increase their effectiveness. They should be disposed after a single use. Female condom A female condom is a soft, loose-fitting sheath that is put into the vagina before sex. The condom keeps sperm from going  inside a woman's body. They should be disposed after a single use. Diaphragm A diaphragm is a soft, dome-shaped barrier. It is inserted into the vagina before sex, along with a spermicide. The diaphragm blocks sperm from entering the uterus, and the spermicide kills sperm. A diaphragm should be left in the vagina for 6-8 hours after sex and removed within 24 hours. A diaphragm is prescribed and fitted by a health care provider. A diaphragm should be replaced every 1-2 years, after giving birth, after gaining more than 15 lb (6.8 kg), and after pelvic surgery. Cervical cap A cervical cap is a round, soft latex or plastic cup that fits over the cervix. It is inserted into the vagina before sex, along with spermicide. It blocks sperm from entering the uterus. The cap should be left in place for 6-8 hours after sex and removed within 48 hours. A cervical cap must be prescribed and fitted by a health care provider. It should be replaced every 2 years. Sponge A sponge is a soft, circular piece of polyurethane foam with spermicide on it. The sponge helps block sperm from entering the uterus, and the spermicide kills sperm. To use it, you make it wet and then insert it into the vagina. It should be inserted before sex, left in for at least 6 hours after sex, and removed and thrown away within 30 hours. Spermicides Spermicides are chemicals that kill or block sperm from entering the cervix and uterus. They can come as a cream, jelly, suppository, foam, or tablet. A spermicide should be inserted into the vagina with an applicator at least 10-15 minutes before   sex to allow time for it to work. The process must be repeated every time you have sex. Spermicides do not require a prescription. Intrauterine contraception Intrauterine device (IUD) An IUD is a T-shaped device that is put in a woman's uterus. There are two types:  Hormone IUD.This type contains progestin, a synthetic form of the hormone progesterone. This  type can stay in place for 3-5 years.  Copper IUD.This type is wrapped in copper wire. It can stay in place for 10 years.  Permanent methods of contraception Female tubal ligation In this method, a woman's fallopian tubes are sealed, tied, or blocked during surgery to prevent eggs from traveling to the uterus. Hysteroscopic sterilization In this method, a small, flexible insert is placed into each fallopian tube. The inserts cause scar tissue to form in the fallopian tubes and block them, so sperm cannot reach an egg. The procedure takes about 3 months to be effective. Another form of birth control must be used during those 3 months. Female sterilization This is a procedure to tie off the tubes that carry sperm (vasectomy). After the procedure, the man can still ejaculate fluid (semen). Natural planning methods Natural family planning In this method, a couple does not have sex on days when the woman could become pregnant. Calendar method This means keeping track of the length of each menstrual cycle, identifying the days when pregnancy can happen, and not having sex on those days. Ovulation method In this method, a couple avoids sex during ovulation. Symptothermal method This method involves not having sex during ovulation. The woman typically checks for ovulation by watching changes in her temperature and in the consistency of cervical mucus. Post-ovulation method In this method, a couple waits to have sex until after ovulation. Summary  Contraception, also called birth control, means methods or devices that prevent pregnancy.  Hormonal methods of contraception include implants, injections, pills, patches, vaginal rings, and emergency contraceptives.  Barrier methods of contraception can include female condoms, female condoms, diaphragms, cervical caps, sponges, and spermicides.  There are two types of IUDs (intrauterine devices). An IUD can be put in a woman's uterus to prevent pregnancy  for 3-5 years.  Permanent sterilization can be done through a procedure for males, females, or both.  Natural family planning methods involve not having sex on days when the woman could become pregnant. This information is not intended to replace advice given to you by your health care provider. Make sure you discuss any questions you have with your health care provider. Document Released: 01/09/2005 Document Revised: 02/12/2016 Document Reviewed: 02/12/2016 Elsevier Interactive Patient Education  2018 Elsevier Inc.  

## 2017-01-17 NOTE — Progress Notes (Signed)
   PRENATAL VISIT NOTE  Subjective:  Nidya Doniqua DoLuther Parodywning is a 21 y.o. G3P0110 at 1767w1d being seen today for ongoing prenatal care.  She is currently monitored for the following issues for this high-risk pregnancy and has Supervision of high-risk pregnancy; History of stillbirth in currently pregnant patient; Group B streptococcal bacteriuria; Eczema; Allergic rhinitis; and Umbilical vein abnormality affecting pregnancy on their problem list.  Patient reports pelvic pressure.  Contractions: Not present. Vag. Bleeding: None.  Movement: Present. Denies leaking of fluid.   The following portions of the patient's history were reviewed and updated as appropriate: allergies, current medications, past family history, past medical history, past social history, past surgical history and problem list. Problem list updated.  Objective:   Vitals:   01/17/17 1306  BP: 106/71  Pulse: 69    Fetal Status: Fetal Heart Rate (bpm): 156   Movement: Present     General:  Alert, oriented and cooperative. Patient is in no acute distress.  Skin: Skin is warm and dry. No rash noted.   Cardiovascular: Normal heart rate noted  Respiratory: Normal respiratory effort, no problems with respiration noted  Abdomen: Soft, gravid, appropriate for gestational age.  Pain/Pressure: Present     Pelvic: Cervical exam deferred        Extremities: Normal range of motion.  Edema: None  Mental Status:  Normal mood and affect. Normal behavior. Normal judgment and thought content.   Assessment and Plan:  Pregnancy: G3P0110 at 6567w1d  1. Group B streptococcal bacteriuria ppx in labor  2. Supervision of high risk pregnancy in third trimester Last growth 11/29 36th%ile Next growth today Reviewed importance of keeping visits with [facility] clinic Reviewed options for contraception today, gave info  3. History of stillbirth in pregnant patient in third trimester, antepartum  4. Umbilical vein abnormality affecting pregnancy,  single or unspecified fetus 2x weekly NST/BPP Has been compliant with MFM visits   Preterm labor symptoms and general obstetric precautions including but not limited to vaginal bleeding, contractions, leaking of fluid and fetal movement were reviewed in detail with the patient. Please refer to After Visit Summary for other counseling recommendations.  Return in about 1 week (around 01/24/2017) for OB visit (MD).   Conan BowensKelly M Elisabel Hanover, MD

## 2017-01-18 ENCOUNTER — Ambulatory Visit (HOSPITAL_COMMUNITY): Payer: Self-pay

## 2017-01-22 ENCOUNTER — Ambulatory Visit (HOSPITAL_COMMUNITY): Admission: RE | Admit: 2017-01-22 | Payer: Self-pay | Source: Ambulatory Visit

## 2017-01-23 NOTE — L&D Delivery Note (Signed)
Delivery Note At 3:48 AM a viable female was delivered via Vaginal, Spontaneous (Presentation:LOA ).  APGAR:8 ,9 ; weight pending  .   Placenta status: schultz ,complete/intact .  Cord:  with the following complications: none.  Cord pH:NA  Anesthesia:  Epidural/local  Episiotomy: None Lacerations: 1st degree;Periurethral Suture Repair: 4.0 monocryl  Est. Blood Loss (mL): 200  Mom to postpartum.  Baby to Couplet care / Skin to Skin.  Thressa ShellerHeather Sharece Fleischhacker 01/31/2017, 4:15 AM

## 2017-01-24 ENCOUNTER — Ambulatory Visit (INDEPENDENT_AMBULATORY_CARE_PROVIDER_SITE_OTHER): Payer: Self-pay | Admitting: Obstetrics and Gynecology

## 2017-01-24 ENCOUNTER — Encounter: Payer: Self-pay | Admitting: Obstetrics and Gynecology

## 2017-01-24 VITALS — BP 91/51 | HR 63

## 2017-01-24 DIAGNOSIS — O358XX0 Maternal care for other (suspected) fetal abnormality and damage, not applicable or unspecified: Secondary | ICD-10-CM

## 2017-01-24 DIAGNOSIS — O09293 Supervision of pregnancy with other poor reproductive or obstetric history, third trimester: Secondary | ICD-10-CM

## 2017-01-24 DIAGNOSIS — Z113 Encounter for screening for infections with a predominantly sexual mode of transmission: Secondary | ICD-10-CM

## 2017-01-24 DIAGNOSIS — O0993 Supervision of high risk pregnancy, unspecified, third trimester: Secondary | ICD-10-CM

## 2017-01-24 DIAGNOSIS — O0990 Supervision of high risk pregnancy, unspecified, unspecified trimester: Secondary | ICD-10-CM

## 2017-01-24 DIAGNOSIS — R8271 Bacteriuria: Secondary | ICD-10-CM

## 2017-01-24 LAB — OB RESULTS CONSOLE GC/CHLAMYDIA: GC PROBE AMP, GENITAL: NEGATIVE

## 2017-01-24 LAB — OB RESULTS CONSOLE GBS: GBS: POSITIVE

## 2017-01-24 NOTE — Progress Notes (Signed)
   PRENATAL VISIT NOTE  Subjective:  Destiny Wu is a 22 y.o. G3P0110 at 2121w1d being seen today for ongoing prenatal care.  She is currently monitored for the following issues for this high-risk pregnancy and has Supervision of high-risk pregnancy; History of stillbirth in currently pregnant patient; Group B streptococcal bacteriuria; Eczema; Allergic rhinitis; and Umbilical vein abnormality affecting pregnancy on their problem list.  Patient reports no complaints.  Contractions: Not present. Vag. Bleeding: None.  Movement: Present. Denies leaking of fluid.   The following portions of the patient's history were reviewed and updated as appropriate: allergies, current medications, past family history, past medical history, past social history, past surgical history and problem list. Problem list updated.  Objective:   Vitals:   01/24/17 1620  BP: (!) 91/51  Pulse: 63    Fetal Status: Fetal Heart Rate (bpm): NST   Movement: Present     General:  Alert, oriented and cooperative. Patient is in no acute distress.  Skin: Skin is warm and dry. No rash noted.   Cardiovascular: Normal heart rate noted  Respiratory: Normal respiratory effort, no problems with respiration noted  Abdomen: Soft, gravid, appropriate for gestational age.  Pain/Pressure: Present     Pelvic: Cervical exam deferred        Extremities: Normal range of motion.  Edema: None  Mental Status:  Normal mood and affect. Normal behavior. Normal judgment and thought content.   Assessment and Plan:  Pregnancy: G3P0110 at 3621w1d  1. Supervision of high risk pregnancy, antepartum GC/CT today  2. History of stillbirth in pregnant patient in third trimester, antepartum BPP tomorrow with MFM NST reactive today  3. Group B streptococcal bacteriuria ppx in labor  4. Umbilical vein abnormality affecting pregnancy, single or unspecified fetus BPP tomorrow Possible induction at 37 weeks Will return Monday for MD visit  to make delivery plan  Preterm labor symptoms and general obstetric precautions including but not limited to vaginal bleeding, contractions, leaking of fluid and fetal movement were reviewed in detail with the patient. Please refer to After Visit Summary for other counseling recommendations.  Return in about 5 days (around 01/29/2017) for NST/BPP (1015) and HOB visit ([address]).   Conan BowensKelly M Davis, MD

## 2017-01-24 NOTE — Patient Instructions (Signed)
Contraception Choices Contraception, also called birth control, refers to methods or devices that prevent pregnancy. Hormonal methods Contraceptive implant A contraceptive implant is a thin, plastic tube that contains a hormone. It is inserted into the upper part of the arm. It can remain in place for up to 3 years. Progestin-only injections Progestin-only injections are injections of progestin, a synthetic form of the hormone progesterone. They are given every 3 months by a health care provider. Birth control pills Birth control pills are pills that contain hormones that prevent pregnancy. They must be taken once a day, preferably at the same time each day. Birth control patch The birth control patch contains hormones that prevent pregnancy. It is placed on the skin and must be changed once a week for three weeks and removed on the fourth week. A prescription is needed to use this method of contraception. Vaginal ring A vaginal ring contains hormones that prevent pregnancy. It is placed in the vagina for three weeks and removed on the fourth week. After that, the process is repeated with a new ring. A prescription is needed to use this method of contraception. Emergency contraceptive Emergency contraceptives prevent pregnancy after unprotected sex. They come in pill form and can be taken up to 5 days after sex. They work best the sooner they are taken after having sex. Most emergency contraceptives are available without a prescription. This method should not be used as your only form of birth control. Barrier methods Female condom A female condom is a thin sheath that is worn over the penis during sex. Condoms keep sperm from going inside a woman's body. They can be used with a spermicide to increase their effectiveness. They should be disposed after a single use. Female condom A female condom is a soft, loose-fitting sheath that is put into the vagina before sex. The condom keeps sperm from going  inside a woman's body. They should be disposed after a single use. Diaphragm A diaphragm is a soft, dome-shaped barrier. It is inserted into the vagina before sex, along with a spermicide. The diaphragm blocks sperm from entering the uterus, and the spermicide kills sperm. A diaphragm should be left in the vagina for 6-8 hours after sex and removed within 24 hours. A diaphragm is prescribed and fitted by a health care provider. A diaphragm should be replaced every 1-2 years, after giving birth, after gaining more than 15 lb (6.8 kg), and after pelvic surgery. Cervical cap A cervical cap is a round, soft latex or plastic cup that fits over the cervix. It is inserted into the vagina before sex, along with spermicide. It blocks sperm from entering the uterus. The cap should be left in place for 6-8 hours after sex and removed within 48 hours. A cervical cap must be prescribed and fitted by a health care provider. It should be replaced every 2 years. Sponge A sponge is a soft, circular piece of polyurethane foam with spermicide on it. The sponge helps block sperm from entering the uterus, and the spermicide kills sperm. To use it, you make it wet and then insert it into the vagina. It should be inserted before sex, left in for at least 6 hours after sex, and removed and thrown away within 30 hours. Spermicides Spermicides are chemicals that kill or block sperm from entering the cervix and uterus. They can come as a cream, jelly, suppository, foam, or tablet. A spermicide should be inserted into the vagina with an applicator at least 10-15 minutes before   sex to allow time for it to work. The process must be repeated every time you have sex. Spermicides do not require a prescription. Intrauterine contraception Intrauterine device (IUD) An IUD is a T-shaped device that is put in a woman's uterus. There are two types:  Hormone IUD.This type contains progestin, a synthetic form of the hormone progesterone. This  type can stay in place for 3-5 years.  Copper IUD.This type is wrapped in copper wire. It can stay in place for 10 years.  Permanent methods of contraception Female tubal ligation In this method, a woman's fallopian tubes are sealed, tied, or blocked during surgery to prevent eggs from traveling to the uterus. Hysteroscopic sterilization In this method, a small, flexible insert is placed into each fallopian tube. The inserts cause scar tissue to form in the fallopian tubes and block them, so sperm cannot reach an egg. The procedure takes about 3 months to be effective. Another form of birth control must be used during those 3 months. Female sterilization This is a procedure to tie off the tubes that carry sperm (vasectomy). After the procedure, the man can still ejaculate fluid (semen). Natural planning methods Natural family planning In this method, a couple does not have sex on days when the woman could become pregnant. Calendar method This means keeping track of the length of each menstrual cycle, identifying the days when pregnancy can happen, and not having sex on those days. Ovulation method In this method, a couple avoids sex during ovulation. Symptothermal method This method involves not having sex during ovulation. The woman typically checks for ovulation by watching changes in her temperature and in the consistency of cervical mucus. Post-ovulation method In this method, a couple waits to have sex until after ovulation. Summary  Contraception, also called birth control, means methods or devices that prevent pregnancy.  Hormonal methods of contraception include implants, injections, pills, patches, vaginal rings, and emergency contraceptives.  Barrier methods of contraception can include female condoms, female condoms, diaphragms, cervical caps, sponges, and spermicides.  There are two types of IUDs (intrauterine devices). An IUD can be put in a woman's uterus to prevent pregnancy  for 3-5 years.  Permanent sterilization can be done through a procedure for males, females, or both.  Natural family planning methods involve not having sex on days when the woman could become pregnant. This information is not intended to replace advice given to you by your health care provider. Make sure you discuss any questions you have with your health care provider. Document Released: 01/09/2005 Document Revised: 02/12/2016 Document Reviewed: 02/12/2016 Elsevier Interactive Patient Education  2018 Elsevier Inc.  

## 2017-01-25 ENCOUNTER — Ambulatory Visit (HOSPITAL_COMMUNITY)
Admission: RE | Admit: 2017-01-25 | Discharge: 2017-01-25 | Disposition: A | Discharge status: Home / Self Care | Payer: Medicaid Other | Source: Ambulatory Visit | Attending: Family Medicine | Admitting: Family Medicine

## 2017-01-25 ENCOUNTER — Encounter (HOSPITAL_COMMUNITY): Payer: Self-pay

## 2017-01-25 ENCOUNTER — Other Ambulatory Visit (HOSPITAL_COMMUNITY): Payer: Self-pay | Admitting: Obstetrics and Gynecology

## 2017-01-25 DIAGNOSIS — O0990 Supervision of high risk pregnancy, unspecified, unspecified trimester: Secondary | ICD-10-CM

## 2017-01-25 DIAGNOSIS — O09299 Supervision of pregnancy with other poor reproductive or obstetric history, unspecified trimester: Secondary | ICD-10-CM

## 2017-01-25 DIAGNOSIS — R8271 Bacteriuria: Secondary | ICD-10-CM

## 2017-01-25 DIAGNOSIS — O358XX0 Maternal care for other (suspected) fetal abnormality and damage, not applicable or unspecified: Secondary | ICD-10-CM | POA: Diagnosis present

## 2017-01-25 DIAGNOSIS — Z3A36 36 weeks gestation of pregnancy: Secondary | ICD-10-CM

## 2017-01-25 DIAGNOSIS — O09293 Supervision of pregnancy with other poor reproductive or obstetric history, third trimester: Secondary | ICD-10-CM

## 2017-01-25 LAB — GC/CHLAMYDIA PROBE AMP (~~LOC~~) NOT AT ARMC
CHLAMYDIA, DNA PROBE: NEGATIVE
Neisseria Gonorrhea: NEGATIVE

## 2017-01-29 ENCOUNTER — Ambulatory Visit (INDEPENDENT_AMBULATORY_CARE_PROVIDER_SITE_OTHER): Payer: Self-pay | Admitting: Family Medicine

## 2017-01-29 ENCOUNTER — Encounter (HOSPITAL_COMMUNITY): Payer: Self-pay | Admitting: *Deleted

## 2017-01-29 ENCOUNTER — Ambulatory Visit (INDEPENDENT_AMBULATORY_CARE_PROVIDER_SITE_OTHER): Payer: Self-pay | Admitting: *Deleted

## 2017-01-29 ENCOUNTER — Telehealth (HOSPITAL_COMMUNITY): Payer: Self-pay | Admitting: *Deleted

## 2017-01-29 VITALS — BP 105/73 | HR 74 | Wt 204.8 lb

## 2017-01-29 DIAGNOSIS — O358XX0 Maternal care for other (suspected) fetal abnormality and damage, not applicable or unspecified: Secondary | ICD-10-CM

## 2017-01-29 DIAGNOSIS — O0990 Supervision of high risk pregnancy, unspecified, unspecified trimester: Secondary | ICD-10-CM

## 2017-01-29 DIAGNOSIS — Z8759 Personal history of other complications of pregnancy, childbirth and the puerperium: Secondary | ICD-10-CM

## 2017-01-29 DIAGNOSIS — O0993 Supervision of high risk pregnancy, unspecified, third trimester: Secondary | ICD-10-CM

## 2017-01-29 DIAGNOSIS — R8271 Bacteriuria: Secondary | ICD-10-CM

## 2017-01-29 DIAGNOSIS — O09293 Supervision of pregnancy with other poor reproductive or obstetric history, third trimester: Secondary | ICD-10-CM

## 2017-01-29 NOTE — Telephone Encounter (Signed)
Preadmission screen  

## 2017-01-29 NOTE — Progress Notes (Signed)
   PRENATAL VISIT NOTE  Subjective:  Destiny Wu is a 22 y.o. G3P0110 at 7062w6d being seen today for ongoing prenatal care.  She is currently monitored for the following issues for this high-risk pregnancy and has Supervision of high-risk pregnancy; History of stillbirth in currently pregnant patient; Group B streptococcal bacteriuria; Eczema; Allergic rhinitis; and Umbilical vein abnormality affecting pregnancy on their problem list.  Patient reports no complaints.  Contractions: Irregular. Vag. Bleeding: None.  Movement: Present. Denies leaking of fluid.   The following portions of the patient's history were reviewed and updated as appropriate: allergies, current medications, past family history, past medical history, past social history, past surgical history and problem list. Problem list updated.  Objective:   Vitals:   01/29/17 1053  BP: 105/73  Pulse: 74  Weight: 204 lb 12.8 oz (92.9 kg)    Fetal Status: Fetal Heart Rate (bpm): NST   Movement: Present     General:  Alert, oriented and cooperative. Patient is in no acute distress.  Skin: Skin is warm and dry. No rash noted.   Cardiovascular: Normal heart rate noted  Respiratory: Normal respiratory effort, no problems with respiration noted  Abdomen: Soft, gravid, appropriate for gestational age.  Pain/Pressure: Present     Pelvic: Cervical exam deferred        Extremities: Normal range of motion.     Mental Status:  Normal mood and affect. Normal behavior. Normal judgment and thought content.   Assessment and Plan:  Pregnancy: G3P0110 at 8162w6d  1. Supervision of high risk pregnancy, antepartum FHT normal  2. Group B streptococcal bacteriuria Intrapartum prophylaxis  3. Umbilical vein abnormality affecting pregnancy, single or unspecified fetus Induction tomorrow NST reactive. BPP last week 8/8  4. History of IUFD   Term labor symptoms and general obstetric precautions including but not limited to  vaginal bleeding, contractions, leaking of fluid and fetal movement were reviewed in detail with the patient. Please refer to After Visit Summary for other counseling recommendations.  Return in about 4 weeks (around 02/26/2017) for Postpartum Exam.   Levie HeritageJacob J Bridgid Printz, DO

## 2017-01-29 NOTE — Progress Notes (Signed)
BPP 8/8 on 1/3 @ MFM - recommendation for IOL @ 37 wks due to UVV.  IOL scheduled on 1/8 @ 0730

## 2017-01-30 ENCOUNTER — Inpatient Hospital Stay (HOSPITAL_COMMUNITY)
Admission: RE | Admit: 2017-01-30 | Discharge: 2017-02-02 | DRG: 807 | Disposition: A | Discharge status: Home / Self Care | Payer: Medicaid Other | Source: Ambulatory Visit | Attending: Family Medicine | Admitting: Family Medicine

## 2017-01-30 ENCOUNTER — Encounter (HOSPITAL_COMMUNITY): Payer: Self-pay

## 2017-01-30 DIAGNOSIS — O99824 Streptococcus B carrier state complicating childbirth: Secondary | ICD-10-CM | POA: Diagnosis present

## 2017-01-30 DIAGNOSIS — R8271 Bacteriuria: Secondary | ICD-10-CM

## 2017-01-30 DIAGNOSIS — O09299 Supervision of pregnancy with other poor reproductive or obstetric history, unspecified trimester: Secondary | ICD-10-CM

## 2017-01-30 DIAGNOSIS — Z87891 Personal history of nicotine dependence: Secondary | ICD-10-CM

## 2017-01-30 DIAGNOSIS — O358XX0 Maternal care for other (suspected) fetal abnormality and damage, not applicable or unspecified: Secondary | ICD-10-CM | POA: Diagnosis present

## 2017-01-30 DIAGNOSIS — Z3A37 37 weeks gestation of pregnancy: Secondary | ICD-10-CM

## 2017-01-30 LAB — CBC
HCT: 32.6 % — ABNORMAL LOW (ref 36.0–46.0)
Hemoglobin: 11.5 g/dL — ABNORMAL LOW (ref 12.0–15.0)
MCH: 32.6 pg (ref 26.0–34.0)
MCHC: 35.3 g/dL (ref 30.0–36.0)
MCV: 92.4 fL (ref 78.0–100.0)
PLATELETS: 217 10*3/uL (ref 150–400)
RBC: 3.53 MIL/uL — ABNORMAL LOW (ref 3.87–5.11)
RDW: 13.8 % (ref 11.5–15.5)
WBC: 11 10*3/uL — ABNORMAL HIGH (ref 4.0–10.5)

## 2017-01-30 LAB — TYPE AND SCREEN
ABO/RH(D): A POS
Antibody Screen: NEGATIVE

## 2017-01-30 LAB — RPR: RPR Ser Ql: NONREACTIVE

## 2017-01-30 MED ORDER — MISOPROSTOL 50MCG HALF TABLET
50.0000 ug | ORAL_TABLET | ORAL | Status: DC | PRN
  Administered 2017-01-30 (×2): 50 ug via BUCCAL
  Filled 2017-01-30 (×3): qty 1

## 2017-01-30 MED ORDER — PHENYLEPHRINE 40 MCG/ML (10ML) SYRINGE FOR IV PUSH (FOR BLOOD PRESSURE SUPPORT)
80.0000 ug | PREFILLED_SYRINGE | INTRAVENOUS | Status: DC | PRN
  Filled 2017-01-30: qty 5

## 2017-01-30 MED ORDER — ONDANSETRON HCL 4 MG/2ML IJ SOLN: 4.0000 mg | Freq: Four times a day (QID) | INTRAMUSCULAR | Status: DC | PRN

## 2017-01-30 MED ORDER — ACETAMINOPHEN 325 MG PO TABS
650.0000 mg | ORAL_TABLET | ORAL | Status: DC | PRN
  Administered 2017-01-30: 650 mg via ORAL
  Filled 2017-01-30: qty 2

## 2017-01-30 MED ORDER — PENICILLIN G POTASSIUM 5000000 UNITS IJ SOLR
5.0000 10*6.[IU] | Freq: Once | INTRAVENOUS | Status: AC
  Administered 2017-01-30: 5 10*6.[IU] via INTRAVENOUS
  Filled 2017-01-30: qty 5

## 2017-01-30 MED ORDER — OXYTOCIN 40 UNITS IN LACTATED RINGERS INFUSION - SIMPLE MED: 2.5000 [IU]/h | INTRAVENOUS | Status: DC

## 2017-01-30 MED ORDER — FENTANYL 2.5 MCG/ML BUPIVACAINE 1/10 % EPIDURAL INFUSION (WH - ANES)
14.0000 mL/h | INTRAMUSCULAR | Status: DC | PRN
  Administered 2017-01-31: 14 mL/h via EPIDURAL
  Filled 2017-01-30: qty 100

## 2017-01-30 MED ORDER — TERBUTALINE SULFATE 1 MG/ML IJ SOLN
0.2500 mg | Freq: Once | INTRAMUSCULAR | Status: DC | PRN
  Filled 2017-01-30: qty 1

## 2017-01-30 MED ORDER — FLEET ENEMA 7-19 GM/118ML RE ENEM: 1.0000 | ENEMA | RECTAL | Status: DC | PRN

## 2017-01-30 MED ORDER — LACTATED RINGERS IV SOLN
500.0000 mL | INTRAVENOUS | Status: DC | PRN
Start: 2017-01-30 — End: 2017-01-31

## 2017-01-30 MED ORDER — FENTANYL CITRATE (PF) 100 MCG/2ML IJ SOLN
100.0000 ug | INTRAMUSCULAR | Status: DC | PRN
  Administered 2017-01-30: 100 ug via INTRAVENOUS
  Filled 2017-01-30: qty 2

## 2017-01-30 MED ORDER — OXYTOCIN BOLUS FROM INFUSION
500.0000 mL | Freq: Once | INTRAVENOUS | Status: AC
  Administered 2017-01-31: 500 mL via INTRAVENOUS

## 2017-01-30 MED ORDER — SOD CITRATE-CITRIC ACID 500-334 MG/5ML PO SOLN: 30.0000 mL | ORAL | Status: DC | PRN

## 2017-01-30 MED ORDER — OXYCODONE-ACETAMINOPHEN 5-325 MG PO TABS: 2.0000 | ORAL_TABLET | ORAL | Status: DC | PRN

## 2017-01-30 MED ORDER — OXYTOCIN 40 UNITS IN LACTATED RINGERS INFUSION - SIMPLE MED
1.0000 m[IU]/min | INTRAVENOUS | Status: DC
  Administered 2017-01-30: 2 m[IU]/min via INTRAVENOUS
  Filled 2017-01-30: qty 1000

## 2017-01-30 MED ORDER — LACTATED RINGERS IV SOLN
500.0000 mL | Freq: Once | INTRAVENOUS | Status: AC
  Administered 2017-01-31: 500 mL via INTRAVENOUS

## 2017-01-30 MED ORDER — LACTATED RINGERS IV SOLN
INTRAVENOUS | Status: DC
  Administered 2017-01-30 (×2): via INTRAVENOUS

## 2017-01-30 MED ORDER — DIPHENHYDRAMINE HCL 50 MG/ML IJ SOLN: 12.5000 mg | INTRAMUSCULAR | Status: DC | PRN

## 2017-01-30 MED ORDER — PENICILLIN G POT IN DEXTROSE 60000 UNIT/ML IV SOLN
3.0000 10*6.[IU] | INTRAVENOUS | Status: DC
  Administered 2017-01-30 – 2017-01-31 (×4): 3 10*6.[IU] via INTRAVENOUS
  Filled 2017-01-30 (×7): qty 50

## 2017-01-30 MED ORDER — OXYTOCIN 40 UNITS IN LACTATED RINGERS INFUSION - SIMPLE MED: 1.0000 m[IU]/min | INTRAVENOUS | Status: DC

## 2017-01-30 MED ORDER — PHENYLEPHRINE 40 MCG/ML (10ML) SYRINGE FOR IV PUSH (FOR BLOOD PRESSURE SUPPORT)
80.0000 ug | PREFILLED_SYRINGE | INTRAVENOUS | Status: DC | PRN
  Filled 2017-01-30: qty 10
  Filled 2017-01-30: qty 5

## 2017-01-30 MED ORDER — OXYCODONE-ACETAMINOPHEN 5-325 MG PO TABS: 1.0000 | ORAL_TABLET | ORAL | Status: DC | PRN

## 2017-01-30 MED ORDER — EPHEDRINE 5 MG/ML INJ
10.0000 mg | INTRAVENOUS | Status: DC | PRN
  Filled 2017-01-30: qty 2

## 2017-01-30 MED ORDER — LIDOCAINE HCL (PF) 1 % IJ SOLN
30.0000 mL | INTRAMUSCULAR | Status: AC | PRN
  Administered 2017-01-31: 30 mL via SUBCUTANEOUS
  Filled 2017-01-30: qty 30

## 2017-01-30 NOTE — H&P (Signed)
OBSTETRIC ADMISSION HISTORY AND PHYSICAL  Destiny Wu is a 22 y.o. female G3P0110  with IUP at 5540w0d by LMP, confirmed with u/s presenting for IOL 2/2 umbilical vein abnormality. BPP 8/8 on 1/3 @ MFM - recommendation for IOL @ 37 wks due to UVV. She reports +FMs, No LOF, no VB, no blurry vision, headaches or peripheral edema, and RUQ pain.  She plans on formula feeding. She request nexplanon for birth control  but is unsure. [facility]   Dating: By U/s --->  Estimated Date of Delivery: 02/20/17  Sono:  @[redacted]w[redacted]d , CWD, normal anatomy, cephalic presentation,  52% EFW   Prenatal History/Complications: Hx IUFD, UVV, GBS+  Past Medical History: Past Medical History:  Diagnosis Date  . History of IUFD 2014  . UTI (urinary tract infection)     Past Surgical History: Past Surgical History:  Procedure Laterality Date  . NO PAST SURGERIES      Obstetrical History: OB History    Gravida Para Term Preterm AB Living   3 1   1 1  0   SAB TAB Ectopic Multiple Live Births   1              Social History: Social History   Socioeconomic History  . Marital status: Single    Spouse name: None  . Number of children: None  . Years of education: None  . Highest education level: None  Social Needs  . Financial resource strain: None  . Food insecurity - worry: None  . Food insecurity - inability: None  . Transportation needs - medical: None  . Transportation needs - non-medical: None  Occupational History  . None  Tobacco Use  . Smoking status: Former Smoker    Packs/day: 0.50    Years: 1.00    Pack years: 0.50    Types: Cigarettes    Last attempt to quit: 01/30/2016    Years since quitting: 1.0  . Smokeless tobacco: Never Used  Substance and Sexual Activity  . Alcohol use: No  . Drug use: No  . Sexual activity: Yes    Birth control/protection: None  Other Topics Concern  . None  Social History Narrative  . None    Family History: Family  History  Problem Relation Age of Onset  . Diabetes Maternal Grandmother   . Asthma Maternal Grandmother   . Hypertension Maternal Grandmother   . Asthma Mother   . Asthma Maternal Grandfather   . Diabetes Maternal Grandfather   . Hypertension Maternal Grandfather     Allergies: No Known Allergies  Medications Prior to Admission  Medication Sig Dispense Refill Last Dose  . acetaminophen (TYLENOL) 500 MG tablet Take 500 mg by mouth every 6 (six) hours as needed for mild pain or headache.   Past Month at Unknown time  . Prenatal Multivit-Min-Fe-FA (PRENATAL VITAMINS) 0.8 MG tablet Take 1 tablet by mouth daily. 30 tablet 12 01/29/2017 at Unknown time  . triamcinolone ointment (KENALOG) 0.5 % Apply 1 application topically 2 (two) times daily. 30 g 0 01/29/2017 at Unknown time  . triamcinolone cream (KENALOG) 0.1 % Apply 1 application topically 2 (two) times daily. (Patient not taking: Reported on 01/25/2017) 30 g 0 Not Taking     Review of Systems   All systems reviewed and negative except as stated in HPI  Blood pressure 107/61, pulse 76, temperature 98.4 F (36.9 C), temperature source Oral, resp. rate 20, height 5\' 4"  (1.626 m), weight 93.4  kg (206 lb), last menstrual period 05/16/2016. General appearance: alert, cooperative and appears stated age Lungs: clear to auscultation bilaterally Heart: regular rate and rhythm Abdomen: soft, non-tender; bowel sounds normal Extremities: Homans sign is negative, no sign of DVT Presentation: cephalic Fetal monitoringBaseline: 125 bpm, Variability: Good {> 6 bpm), Accelerations: Reactive, Decelerations: Absent and reactive NST Uterine activity uterine irritability Dilation: 1.5 Effacement (%): 50 Station: -3 Exam by:: Valentina Lucks, RN   Prenatal labs: ABO, Rh: --/--/A POS (01/08 0815) Antibody: NEG (01/08 0815) Rubella: 5.02 (06/27 1410) RPR: Non Reactive (11/07 0930)  HBsAg: Negative (06/27 1410)  HIV: Non Reactive (11/07 0930)  GBS:  Positive (01/02 0000)  1 hr Glucola Nml Genetic screening  Missed first screen appt and did not get further testing Anatomy US nml female  Prenatal Transfer Tool  Maternal Diabetes: No Genetic Screening: Normal Maternal Ultrasounds/Referrals: Normal Fetal Ultrasounds or other Referrals:  None Maternal Substance Abuse:  No Significant Maternal Medications:  None Significant Maternal Lab Results: Lab values include: Group B Strep positive  Results for orders placed or performed during the hospital encounter of 01/30/17 (from the past 24 hour(s))  CBC   Collection Time: 01/30/17  8:15 AM  Result Value Ref Range   WBC 11.0 (H) 4.0 - 10.5 K/uL   RBC 3.53 (L) 3.87 - 5.11 MIL/uL   Hemoglobin 11.5 (L) 12.0 - 15.0 g/dL   HCT 11.9 (L) 14.7 - 82.9 %   MCV 92.4 78.0 - 100.0 fL   MCH 32.6 26.0 - 34.0 pg   MCHC 35.3 30.0 - 36.0 g/dL   RDW 56.2 13.0 - 86.5 %   Platelets 217 150 - 400 K/uL  Type and screen [facility] [place]   Collection Time: 01/30/17  8:15 AM  Result Value Ref Range   ABO/RH(D) A POS    Antibody Screen NEG    Sample Expiration 02/02/2017     Patient Active Problem List   Diagnosis Date Noted  . 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 Eczema 08/16/2016  . Allergic rhinitis 08/16/2016  . Group B streptococcal bacteriuria 07/24/2016  . Supervision of high-risk pregnancy 07/19/2016  . History of stillbirth in currently pregnant patient 07/19/2016    Assessment/Plan:  Destiny Wu is a 22 y.o. G3P0110 at [redacted]w[redacted]d here for IOL 2/2 umbilical vein abnormality. #Labor: IOL. S/p cytotec x1, Q4H. Foley bulb placed ~1000.  #Pain: controlled #FWB:  Category I NST #ID:  GBS+, IAP w/ PCN #MOF: formula #MOC: patient has verbalized preference for nexplanon #Circ:  undecided  Alroy Bailiff, MD  01/30/2017, 9:43 AM   OB FELLOW HISTORY AND PHYSICAL ATTESTATION I have seen and examined this patient; I agree with above documentation in  the resident's note.   IOL for UVV.  SVE 1/30%/-3, FB and cytotec for cervical ripening GBS +, start PCN  Frederik Pear, MD OB Fellow 01/30/2017

## 2017-01-30 NOTE — Progress Notes (Signed)
Labor Progress Note Luther ParodyQuatera Doniqua Poteat is a 22 y.o. G3P0110 at 7148w0d presented for IOL 2/2 umbilical vein abnormality.  S: Patient doing well. No complaints at this time. Occasionally feels contractions.  O:  BP 109/69   Pulse (!) 57   Temp 98 F (36.7 C) (Oral)   Resp 20   Ht 5\' 4"  (1.626 m)   Wt 93.4 kg (206 lb)   LMP 05/16/2016 (Exact Date)   BMI 35.36 kg/m  EFM: 135/mod variability/+ accels, no decels Toco: uterine irritability  CVE: Dilation: 3 Effacement (%): 60 Cervical Position: Posterior Station: -3 Presentation: Vertex Exam by:: Valentina Lucks. Woods, RN   A&P: 22 y.o. G3P0110 3348w0d c/b IUFD here for IOL 2/2 umbilical vein abnormality.  #Labor: Progressing well. FB out at 1800. S/p cytotec x2. #Pain: controlled #FWB: CAT I NST #GBS positive, s/p PCN  Alroy BailiffParker W Ajai Harville, MD 6:00 PM

## 2017-01-30 NOTE — Anesthesia Pain Management Evaluation Note (Signed)
  CRNA Pain Management Visit Note  Patient: Destiny Wu, 22 y.o., female  "Hello I am a member of the anesthesia team at [facility]. We have an anesthesia team available at all times to provide care throughout the hospital, including epidural management and anesthesia for C-section. I don't know your plan for the delivery whether it a natural birth, water birth, IV sedation, nitrous supplementation, doula or epidural, but we want to meet your pain goals."   1.Was your pain managed to your expectations on prior hospitalizations?   No prior hospitalizations  2.What is your expectation for pain management during this hospitalization?     Epidural  3.How can we help you reach that goal? Epidural when ready  Record the patient's initial score and the patient's pain goal.   Pain: 4  Pain Goal: 7 [facility] wants you to be able to say your pain was always managed very well.  Edison PaceWILKERSON,Destiny Wu 01/30/2017

## 2017-01-30 NOTE — Progress Notes (Signed)
Destiny Wu is a 22 y.o. G3P0110 at 4514w0d admitted for induction of labor due to UVV and prior IUFD.  Subjective:  No complaints at this time. No questions. Not feeling contractions much at this time.  Objective: BP (!) 97/45   Pulse 71   Temp 98.7 F (37.1 C) (Oral)   Resp 18   Ht 5\' 4"  (1.626 m)   Wt 206 lb (93.4 kg)   LMP 05/16/2016 (Exact Date)   BMI 35.36 kg/m  No intake/output data recorded. No intake/output data recorded.  FHT:  FHR: 125 bpm, variability: moderate,  accelerations:  Present,  decelerations:  Absent UC:   regular, every 2-3 minutes SVE:   Dilation: 3.5 Effacement (%): 60 Station: -3 Exam by:: [employer]Savannah Brendle RN  Labs: Lab Results  Component Value Date   WBC 11.0 (H) 01/30/2017   HGB 11.5 (L) 01/30/2017   HCT 32.6 (L) 01/30/2017   MCV 92.4 01/30/2017   PLT 217 01/30/2017    Assessment / Plan: Induction of labor due to UVV, and prior hx of IUFD ,  progressing well on pitocin  Labor: Progressing normally Preeclampsia:  NA Fetal Wellbeing:  Category I Pain Control:  Labor support without medications I/D:  n/a Anticipated MOD:  NSVD  Thressa ShellerHeather Pearlena Ow 01/30/2017, 9:00 PM

## 2017-01-30 NOTE — Progress Notes (Signed)
Labor Progress Note Destiny Wu is a 22 y.o. G3P0110 at 5760w0d presented for IOL 2/2 umbilical vein abnormality.   S: Patient doing well, no complaints at this time. Pain is controlled.   O:  BP 110/66   Pulse 63   Temp 98 F (36.7 C) (Oral)   Resp 20   Ht 5\' 4"  (1.626 m)   Wt 93.4 kg (206 lb)   LMP 05/16/2016 (Exact Date)   BMI 35.36 kg/m  EFM: HR 130/mod variability/+ accels, no decels  CVE: Dilation: 2 Effacement (%): 60 Cervical Position: Posterior Station: -3 Presentation: Vertex Exam by:: Valentina Lucks. Woods, RN   Results for orders placed or performed during the hospital encounter of 01/30/17 (from the past 24 hour(s))  CBC     Status: Abnormal   Collection Time: 01/30/17  8:15 AM  Result Value Ref Range   WBC 11.0 (H) 4.0 - 10.5 K/uL   RBC 3.53 (L) 3.87 - 5.11 MIL/uL   Hemoglobin 11.5 (L) 12.0 - 15.0 g/dL   HCT 16.132.6 (L) 09.636.0 - 04.546.0 %   MCV 92.4 78.0 - 100.0 fL   MCH 32.6 26.0 - 34.0 pg   MCHC 35.3 30.0 - 36.0 g/dL   RDW 40.913.8 81.111.5 - 91.415.5 %   Platelets 217 150 - 400 K/uL  Type and screen [facility]      Status: None   Collection Time: 01/30/17  8:15 AM  Result Value Ref Range   ABO/RH(D) A POS    Antibody Screen NEG    Sample Expiration 02/02/2017    A&P: 22 y.o. G3P0110 3260w0d c/b IUFD here for IOL 2/2 umbilical vein abnormality.  #Labor: Progressing well. FB placed around 1030, still in place at 1320. S/p cytotec.  #Pain: controlled #FWB: CAT 1 strip #GBS positive, received PCN  Alroy BailiffParker W Jones Viviani, MD 1:23 PM

## 2017-01-31 ENCOUNTER — Encounter (HOSPITAL_COMMUNITY): Payer: Self-pay

## 2017-01-31 ENCOUNTER — Inpatient Hospital Stay (HOSPITAL_COMMUNITY): Payer: Medicaid Other | Admitting: Anesthesiology

## 2017-01-31 DIAGNOSIS — Z3A37 37 weeks gestation of pregnancy: Secondary | ICD-10-CM

## 2017-01-31 MED ORDER — ACETAMINOPHEN 325 MG PO TABS: 650.0000 mg | ORAL_TABLET | ORAL | Status: DC | PRN

## 2017-01-31 MED ORDER — PRENATAL MULTIVITAMIN CH
1.0000 | ORAL_TABLET | Freq: Every day | ORAL | Status: DC
  Administered 2017-01-31 – 2017-02-01 (×2): 1 via ORAL
  Filled 2017-01-31 (×2): qty 1

## 2017-01-31 MED ORDER — IBUPROFEN 600 MG PO TABS
600.0000 mg | ORAL_TABLET | Freq: Four times a day (QID) | ORAL | Status: DC
  Administered 2017-01-31 – 2017-02-02 (×9): 600 mg via ORAL
  Filled 2017-01-31 (×9): qty 1

## 2017-01-31 MED ORDER — DIPHENHYDRAMINE HCL 25 MG PO CAPS: 25.0000 mg | ORAL_CAPSULE | Freq: Four times a day (QID) | ORAL | Status: DC | PRN

## 2017-01-31 MED ORDER — ONDANSETRON HCL 4 MG PO TABS: 4.0000 mg | ORAL_TABLET | ORAL | Status: DC | PRN

## 2017-01-31 MED ORDER — COCONUT OIL OIL: 1.0000 "application " | TOPICAL_OIL | Status: DC | PRN

## 2017-01-31 MED ORDER — ONDANSETRON HCL 4 MG/2ML IJ SOLN: 4.0000 mg | INTRAMUSCULAR | Status: DC | PRN

## 2017-01-31 MED ORDER — TETANUS-DIPHTH-ACELL PERTUSSIS 5-2.5-18.5 LF-MCG/0.5 IM SUSP: 0.5000 mL | Freq: Once | INTRAMUSCULAR | Status: DC

## 2017-01-31 MED ORDER — BENZOCAINE-MENTHOL 20-0.5 % EX AERO: 1.0000 "application " | INHALATION_SPRAY | CUTANEOUS | Status: DC | PRN

## 2017-01-31 MED ORDER — OXYCODONE-ACETAMINOPHEN 5-325 MG PO TABS: 2.0000 | ORAL_TABLET | ORAL | Status: DC | PRN

## 2017-01-31 MED ORDER — MISOPROSTOL 200 MCG PO TABS
600.0000 ug | ORAL_TABLET | Freq: Once | ORAL | Status: AC
  Administered 2017-01-31: 600 ug via BUCCAL

## 2017-01-31 MED ORDER — ZOLPIDEM TARTRATE 5 MG PO TABS
5.0000 mg | ORAL_TABLET | Freq: Every evening | ORAL | Status: DC | PRN
Start: 2017-01-31 — End: 2017-02-02

## 2017-01-31 MED ORDER — SIMETHICONE 80 MG PO CHEW: 80.0000 mg | CHEWABLE_TABLET | ORAL | Status: DC | PRN

## 2017-01-31 MED ORDER — MISOPROSTOL 200 MCG PO TABS
ORAL_TABLET | ORAL | Status: AC
  Administered 2017-01-31: 600 ug via BUCCAL
  Filled 2017-01-31: qty 3

## 2017-01-31 MED ORDER — WITCH HAZEL-GLYCERIN EX PADS: 1.0000 "application " | MEDICATED_PAD | CUTANEOUS | Status: DC | PRN

## 2017-01-31 MED ORDER — SENNOSIDES-DOCUSATE SODIUM 8.6-50 MG PO TABS
2.0000 | ORAL_TABLET | ORAL | Status: DC
  Administered 2017-02-01 – 2017-02-02 (×2): 2 via ORAL
  Filled 2017-01-31 (×2): qty 2

## 2017-01-31 MED ORDER — OXYCODONE-ACETAMINOPHEN 5-325 MG PO TABS: 1.0000 | ORAL_TABLET | ORAL | Status: DC | PRN

## 2017-01-31 MED ORDER — DIBUCAINE 1 % RE OINT: 1.0000 "application " | TOPICAL_OINTMENT | RECTAL | Status: DC | PRN

## 2017-01-31 MED ORDER — LIDOCAINE HCL (PF) 1 % IJ SOLN
INTRAMUSCULAR | Status: DC | PRN
  Administered 2017-01-31 (×2): 4 mL

## 2017-01-31 NOTE — Anesthesia Postprocedure Evaluation (Signed)
Anesthesia Post Note  Patient: Destiny ParodyQuatera Doniqua Wu  Procedure(s) Performed: AN AD HOC LABOR EPIDURAL     Patient location during evaluation: Mother Baby Anesthesia Type: Epidural Level of consciousness: awake Pain management: pain level controlled Vital Signs Assessment: post-procedure vital signs reviewed and stable Respiratory status: spontaneous breathing Cardiovascular status: stable Postop Assessment: epidural receding and patient able to bend at knees Anesthetic complications: no    Last Vitals:  Vitals:   01/31/17 0610 01/31/17 0733  BP: 106/66 (!) 106/57  Pulse: 67 66  Resp: 18 20  Temp: 37.3 C   SpO2: 100%     Last Pain:  Vitals:   01/31/17 0610  TempSrc: Oral  PainSc: 0-No pain   Pain Goal: Patients Stated Pain Goal: 5 (01/30/17 1030)               Edison PaceWILKERSON,Lanee Chain

## 2017-01-31 NOTE — Anesthesia Procedure Notes (Addendum)
Epidural Patient location during procedure: OB  Staffing Anesthesiologist: Tylan Briguglio, MD Performed: anesthesiologist   Preanesthetic Checklist Completed: patient identified, pre-op evaluation, timeout performed, IV checked, risks and benefits discussed and monitors and equipment checked  Epidural Patient position: sitting Prep: site prepped and draped and DuraPrep Patient monitoring: heart rate, continuous pulse ox and blood pressure Approach: midline Location: L3-L4 Injection technique: LOR air and LOR saline  Needle:  Needle type: Tuohy  Needle gauge: 17 G Needle length: 9 cm Needle insertion depth: 9 cm Catheter type: closed end flexible Catheter size: 19 Gauge Catheter at skin depth: 14 cm Test dose: negative  Assessment Sensory level: T8 Events: blood not aspirated, injection not painful, no injection resistance, negative IV test and no paresthesia  Additional Notes Reason for block:procedure for pain     

## 2017-01-31 NOTE — Anesthesia Preprocedure Evaluation (Signed)
Anesthesia Evaluation  Patient identified by MRN, date of birth, ID band Patient awake    Reviewed: Allergy & Precautions, NPO status , Patient's Chart, lab work & pertinent test results  Airway Mallampati: II  TM Distance: >3 FB Neck ROM: Full    Dental no notable dental hx.    Pulmonary former smoker,    Pulmonary exam normal breath sounds clear to auscultation       Cardiovascular negative cardio ROS Normal cardiovascular exam Rhythm:Regular Rate:Normal     Neuro/Psych negative neurological ROS  negative psych ROS   GI/Hepatic negative GI ROS, Neg liver ROS,   Endo/Other  negative endocrine ROS  Renal/GU negative Renal ROS     Musculoskeletal negative musculoskeletal ROS (+)   Abdominal (+) + obese,   Peds  Hematology negative hematology ROS (+)   Anesthesia Other Findings   Reproductive/Obstetrics (+) Pregnancy                             Anesthesia Physical Anesthesia Plan  ASA: II  Anesthesia Plan: Epidural   Post-op Pain Management:    Induction:   PONV Risk Score and Plan:   Airway Management Planned:   Additional Equipment:   Intra-op Plan:   Post-operative Plan:   Informed Consent: I have reviewed the patients History and Physical, chart, labs and discussed the procedure including the risks, benefits and alternatives for the proposed anesthesia with the patient or authorized representative who has indicated his/her understanding and acceptance.       Plan Discussed with:   Anesthesia Plan Comments:         Anesthesia Quick Evaluation  

## 2017-02-01 LAB — BIRTH TISSUE RECOVERY COLLECTION (PLACENTA DONATION)

## 2017-02-01 MED ORDER — IBUPROFEN 600 MG PO TABS: 600.0000 mg | ORAL_TABLET | Freq: Four times a day (QID) | ORAL | 0 refills | Status: DC

## 2017-02-01 NOTE — Discharge Summary (Signed)
OB Discharge Summary     Patient Name: Destiny Wu DOB: [birth year] MRN: [id number]  Date of admission: 01/30/2017 Delivering MD: Thressa Sheller D   Date of discharge: 02/01/2017  Admitting diagnosis: INDUCTION Intrauterine pregnancy: 102w1d     Secondary diagnosis:  Active Problems:   History of stillbirth in currently pregnant patient   Group B streptococcal bacteriuria   Umbilical vein abnormality affecting pregnancy      Discharge diagnosis: Term Pregnancy Delivered                                                                                                Post partum procedures:n/a  Augmentation: Pitocin, Cytotec and Foley Balloon  Complications: None  Hospital course:  Induction of Labor With Vaginal Delivery   22 y.o. yo [id number] at [redacted]w[redacted]d was admitted to the hospital 01/30/2017 for induction of labor.  Indication for induction: umbilical vein abnormality.  Patient had an uncomplicated labor course as follows: Membrane Rupture Time/Date: 11:56 PM ,01/30/2017   Intrapartum Procedures: Episiotomy: None [1]                                         Lacerations:  1st degree [2];Periurethral [8]  Patient had delivery of a Viable infant.  Information for the patient's newborn:  Daisee, Centner [[id number]]  Delivery Method: Vaginal, Spontaneous(Filed from Delivery Summary)   01/31/2017  Details of delivery can be found in separate delivery note.  Patient had a routine postpartum course. Patient is discharged home 02/01/17.  Physical exam  Vitals:   01/31/17 0610 01/31/17 0733 01/31/17 1737 02/01/17 0545  BP: 106/66 (!) 106/57 (!) 95/54 104/60  Pulse: 67 66 61 64  Resp: 18 20 18 18   Temp: 99.2 F (37.3 C)  98.5 F (36.9 C) 98.4 F (36.9 C)  TempSrc: Oral  Oral Oral  SpO2: 100%     Weight:      Height:       General: alert Lochia: appropriate Uterine Fundus: firm Incision: N/A DVT Evaluation: No evidence of DVT seen on physical exam. No significant  calf/ankle edema. Labs: Lab Results  Component Value Date   WBC 11.0 (H) 01/30/2017   HGB 11.5 (L) 01/30/2017   HCT 32.6 (L) 01/30/2017   MCV 92.4 01/30/2017   PLT 217 01/30/2017   CMP Latest Ref Rng & Units 05/02/2016  Glucose 65 - 99 mg/dL 89  BUN 6 - 20 mg/dL 5(L)  Creatinine 1.30 - 1.00 mg/dL 8.65  Sodium 784 - 696 mmol/L 139  Potassium 3.5 - 5.1 mmol/L 3.8  Chloride 101 - 111 mmol/L 104  CO2 22 - 32 mmol/L 26  Calcium 8.9 - 10.3 mg/dL 9.3  Total Protein 6.5 - 8.1 g/dL 7.0  Total Bilirubin 0.3 - 1.2 mg/dL 0.6  Alkaline Phos 38 - 126 U/L 51  AST 15 - 41 U/L 18  ALT 14 - 54 U/L 11(L)    Discharge instruction: per After Visit Summary and "Baby and Me Booklet".  After  visit meds:  Allergies as of 02/01/2017   No Known Allergies     Medication List    TAKE these medications   acetaminophen 500 MG tablet Commonly known as:  TYLENOL Take 500 mg by mouth every 6 (six) hours as needed for mild pain or headache.   ibuprofen 600 MG tablet Commonly known as:  ADVIL,MOTRIN Take 1 tablet (600 mg total) by mouth every 6 (six) hours.   Prenatal Vitamins 0.8 MG tablet Take 1 tablet by mouth daily.   triamcinolone ointment 0.5 % Commonly known as:  KENALOG Apply 1 application topically 2 (two) times daily. What changed:  Another medication with the same name was removed. Continue taking this medication, and follow the directions you see here.       Diet: routine diet  Activity: Advance as tolerated. Pelvic rest for 6 weeks.   Outpatient follow up:4 weeks Follow up Appt:No future appointments. Follow up Visit:No Follow-up on file.  Postpartum contraception: Nexplanon  Newborn Data: Live born female  Birth Weight: 5 lb 13.5 oz (2651 g) APGAR: 8, 9  Newborn Delivery   Birth date/time:  01/31/2017 03:48:00 Delivery type:  Vaginal, Spontaneous     Baby Feeding: Bottle Disposition:home with mother   02/01/2017 Alroy BailiffParker W Venkat Ankney, MD

## 2017-02-01 NOTE — Plan of Care (Signed)
Patient progressing appropriately. VS WNL, pain controlled, ambulating, and voiding without any problems.

## 2017-02-01 NOTE — Progress Notes (Signed)
Mom declined wanting a 24 hour D/C due to infant needing to stay. Resident notified and CNM said it was okay to discontinue the D/C order. Royston CowperIsley, Neythan Kozlov E, RN

## 2017-02-02 NOTE — Discharge Summary (Signed)
OB Discharge Summary     Patient Name: Destiny Wu DOB: 10/15/1995 MRN: [id number] Date of admission: 01/30/2017  Delivering MD: Thressa ShellerHOGAN, HEATHER D )  Date of discharge: 02/02/2017   Admitting diagnosis: Induction of labor for UVV and prior IUFD Intrauterine pregnancy: 1973w1d    Secondary diagnosis:  Active Problems:   Patient Active Problem List   Diagnosis Date Noted  . 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 Eczema 08/16/2016  . Allergic rhinitis 08/16/2016  . Group B streptococcal bacteriuria 07/24/2016  . Supervision of high-risk pregnancy 07/19/2016  . History of stillbirth in currently pregnant patient 07/19/2016     Discharge diagnosis: Term Pregnancy Delivered                                                                                                Post partum procedures:none  Augmentation: Pitocin, Cytotec and Foley Balloon  Complications: None  Hospital course:  Induction of Labor With Vaginal Delivery   22 y.o. yo [id number] at 7573w1d was admitted to the hospital 01/30/2017 for induction of labor.  Indication for induction: UVV and prior IUFD.  Patient had an uncomplicated labor course as follows: Membrane Rupture Time/Date: 11:56 PM ,01/30/2017   Intrapartum Procedures: Episiotomy: None [1]                                         Lacerations:  1st degree [2];Periurethral [8]  Patient had delivery of a Viable infant.  Information for the patient's newborn:  Birdena CrandallDowning, Boy Davonda [[id number]][[id number]]  Delivery Method: Vaginal, Spontaneous(Filed from Delivery Summary)   01/31/2017  Details of delivery can be found in separate delivery note.  Patient had a routine postpartum course. Patient is discharged home 02/02/17.  Physical exam  Vitals:   02/01/17 1828 02/02/17 0554  BP: 126/70 122/78  Pulse: 70 71  Resp: 17 16  Temp: 98.7 F (37.1 C) 98.2 F (36.8 C)  SpO2:      General: alert, cooperative and no distress Lochia: appropriate Uterine  Fundus: firm Incision: N/A DVT Evaluation: No evidence of DVT seen on physical exam.  Labs:     Component Value Date/Time   WBC 11.0 (H) 01/30/2017 0815   RBC 3.53 (L) 01/30/2017 0815   HGB 11.5 (L) 01/30/2017 0815   HGB 11.3 11/29/2016 0930   HCT 32.6 (L) 01/30/2017 0815   HCT 32.1 (L) 11/29/2016 0930   PLT 217 01/30/2017 0815   PLT 225 11/29/2016 0930   MCV 92.4 01/30/2017 0815   MCV 92 11/29/2016 0930   MCH 32.6 01/30/2017 0815   MCHC 35.3 01/30/2017 0815   RDW 13.8 01/30/2017 0815   RDW 13.8 11/29/2016 0930   LYMPHSABS 2.0 07/19/2016 1410   MONOABS 0.8 08/25/2014 2041   EOSABS 0.0 07/19/2016 1410   BASOSABS 0.0 07/19/2016 1410   Discharge instruction: per After Visit Summary and "Baby and Me Booklet".  After visit meds:   Allergies as of 02/02/2017   No Known Allergies     Medication  List    TAKE these medications   acetaminophen 500 MG tablet Commonly known as:  TYLENOL Take 500 mg by mouth every 6 (six) hours as needed for mild pain or headache.   ibuprofen 600 MG tablet Commonly known as:  ADVIL,MOTRIN Take 1 tablet (600 mg total) by mouth every 6 (six) hours.   Prenatal Vitamins 0.8 MG tablet Take 1 tablet by mouth daily.   triamcinolone ointment 0.5 % Commonly known as:  KENALOG Apply 1 application topically 2 (two) times daily. What changed:  Another medication with the same name was removed. Continue taking this medication, and follow the directions you see here.       Diet: routine diet  Activity: Advance as tolerated. Pelvic rest for 6 weeks.   Outpatient follow up:4 weeks Future Appointments:  Future Appointments  Date Time Provider Department Center  03/12/2017  2:15 PM Armando Reichert, CNM [facility]    Follow up Appt: No Follow-up on file.   Postpartum contraception: Nexplanon  Newborn Data: APGAR (1 MIN): 8   APGAR (5 MINS): 9   APGAR (10 MINS):    Weight 5 lb 13.5 oz (2651 g)   Baby Feeding: Bottle Disposition:home  with mother  Larose Hires, Medical Student  02/02/2017

## 2017-03-12 ENCOUNTER — Ambulatory Visit: Payer: Self-pay | Admitting: Advanced Practice Midwife

## 2017-05-27 ENCOUNTER — Encounter (HOSPITAL_COMMUNITY): Payer: Self-pay | Admitting: Emergency Medicine

## 2017-05-27 ENCOUNTER — Ambulatory Visit (HOSPITAL_COMMUNITY)
Admission: EM | Admit: 2017-05-27 | Discharge: 2017-05-27 | Disposition: A | Discharge status: Home / Self Care | Payer: Medicaid Other | Attending: Family Medicine | Admitting: Family Medicine

## 2017-05-27 DIAGNOSIS — K0889 Other specified disorders of teeth and supporting structures: Secondary | ICD-10-CM

## 2017-05-27 MED ORDER — AMOXICILLIN-POT CLAVULANATE 875-125 MG PO TABS: 1.0000 | ORAL_TABLET | Freq: Two times a day (BID) | ORAL | 0 refills | Status: AC

## 2017-05-27 MED ORDER — IBUPROFEN 800 MG PO TABS: 800.0000 mg | ORAL_TABLET | Freq: Three times a day (TID) | ORAL | 0 refills | Status: DC

## 2017-05-27 MED ORDER — HYDROCODONE-ACETAMINOPHEN 5-325 MG PO TABS: 1.0000 | ORAL_TABLET | Freq: Four times a day (QID) | ORAL | 0 refills | Status: AC | PRN

## 2017-05-27 NOTE — ED Triage Notes (Signed)
Pt c/o tooth pain x3 days, does not see a dentist.

## 2017-05-27 NOTE — Discharge Instructions (Signed)
Please use dental resource to contact offices to seek permenant treatment/relief.  ° °Today we have given you an antibiotic- Augmentin. This should help with pain as any infection is cleared.  ° °For pain please take 600mg-800mg of Ibuprofen every 8 hours, take with 1000 mg of Tylenol Extra strength every 8 hours. These are safe to take together. Please take with food.  ° °I have also provided 3 days worth of stronger pain medication. This should only be used for severe pain. Do not drive or operate machinery while taking this medication.  ° °Please return if you start to experience significant swelling of your face, experiencing fever. °

## 2017-05-27 NOTE — ED Provider Notes (Signed)
[facility]    CSN: [id number] Arrival date & time: 05/27/17  1155     History   Chief Complaint Chief Complaint  Patient presents with  . Dental Pain    HPI Destiny Wu is a 22 y.o. female left degenerative past medical history presenting today for evaluation of dental pain.  Patient states that she has had pain to her right upper tooth for the past 3 days.  States that she has a hole in her tooth, but is never had issues previously.  Endorsing mild swelling around tooth.  Has tried ibuprofen 800, Excedrin and oxycodone which has not provided her with relief.  Patient does not see a dentist.  Denies any difficulty breathing or trouble swallowing.  HPI  Past Medical History:  Diagnosis Date  . History of IUFD 2014  . UTI (urinary tract infection)     Patient Active Problem List   Diagnosis Date Noted  . 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 Eczema 08/16/2016  . Allergic rhinitis 08/16/2016  . Group B streptococcal bacteriuria 07/24/2016  . Supervision of high-risk pregnancy 07/19/2016  . History of stillbirth in currently pregnant patient 07/19/2016    Past Surgical History:  Procedure Laterality Date  . NO PAST SURGERIES      OB History    Gravida  3   Para  2   Term  1   Preterm  1   AB  1   Living  1     SAB  1   TAB      Ectopic      Multiple  0   Live Births  1            Home Medications    Prior to Admission medications   Medication Sig Start Date End Date Taking? Authorizing Provider  acetaminophen (TYLENOL) 500 MG tablet Take 500 mg by mouth every 6 (six) hours as needed for mild pain or headache.    [provider]  amoxicillin-clavulanate (AUGMENTIN) 875-125 MG tablet Take 1 tablet by mouth every 12 (twelve) hours for 7 days. 05/27/17 06/03/17  Anselma Herbel C, PA-C  HYDROcodone-acetaminophen (NORCO/VICODIN) 5-325 MG tablet Take 1 tablet by mouth every 6 (six) hours as needed  for up to 3 days. 05/27/17 05/30/17  Ether Goebel C, PA-C  ibuprofen (ADVIL,MOTRIN) 800 MG tablet Take 1 tablet (800 mg total) by mouth 3 (three) times daily. 05/27/17   Mauriana Dann C, PA-C  Prenatal Multivit-Min-Fe-FA (PRENATAL VITAMINS) 0.8 MG tablet Take 1 tablet by mouth daily. 07/06/16   Amistad Bing, MD  triamcinolone ointment (KENALOG) 0.5 % Apply 1 application topically 2 (two) times daily. 11/13/16   Lesly Dukes, MD    Family History Family History  Problem Relation Age of Onset  . Diabetes Maternal Grandmother   . Asthma Maternal Grandmother   . Hypertension Maternal Grandmother   . Asthma Mother   . Asthma Maternal Grandfather   . Diabetes Maternal Grandfather   . Hypertension Maternal Grandfather     Social History Social History   Tobacco Use  . Smoking status: Former Smoker    Packs/day: 0.50    Years: 1.00    Pack years: 0.50    Types: Cigarettes    Last attempt to quit: 01/30/2016    Years since quitting: 1.3  . Smokeless tobacco: Never Used  Substance Use Topics  . Alcohol use: No  . Drug use: No     Allergies  Patient has no known allergies.   Review of Systems Review of Systems  Constitutional: Positive for appetite change. Negative for activity change, fatigue and fever.  HENT: Positive for dental problem and ear pain. Negative for facial swelling, sore throat and trouble swallowing.   Respiratory: Negative for shortness of breath.   Gastrointestinal: Negative for abdominal pain, nausea and vomiting.  Musculoskeletal: Negative for myalgias.  Neurological: Positive for headaches. Negative for dizziness, weakness, light-headedness and numbness.     Physical Exam Triage Vital Signs ED Triage Vitals  Enc Vitals Group     BP      Pulse      Resp      Temp      Temp src      SpO2      Weight      Height      Head Circumference      Peak Flow      Pain Score      Pain Loc      Pain Edu?      Excl. in GC?    No data  found.  Updated Vital Signs BP 104/67   Pulse 61   Temp 98.6 F (37 C)   Resp 16   SpO2 99%   Breastfeeding? No   Visual Acuity Right Eye Distance:   Left Eye Distance:   Bilateral Distance:    Right Eye Near:   Left Eye Near:    Bilateral Near:     Physical Exam  Constitutional: She appears well-developed and well-nourished. No distress.  HENT:  Head: Normocephalic and atraumatic.  Mouth/Throat:    Tooth to right upper jaw with fracture, gingival tenderness to surrounding gingiva.  Oropharynx patent, airway intact, no facial swelling  Eyes: Conjunctivae are normal.  Neck: Neck supple.  Cardiovascular: Normal rate and regular rhythm.  No murmur heard. Pulmonary/Chest: Effort normal. No respiratory distress.  Musculoskeletal: She exhibits no edema.  Neurological: She is alert.  Skin: Skin is warm and dry.  Psychiatric: She has a normal mood and affect.  Nursing note and vitals reviewed.    UC Treatments / Results  Labs (all labs ordered are listed, but only abnormal results are displayed) Labs Reviewed - No data to display  EKG None  Radiology No results found.  Procedures Procedures (including critical care time)  Medications Ordered in UC Medications - No data to display  Initial Impression / Assessment and Plan / UC Course  I have reviewed the triage vital signs and the nursing notes.  Pertinent labs & imaging results that were available during my care of the patient were reviewed by me and considered in my medical decision making (see chart for details).     Patient with dental pain, will begin on Augmentin as well as provide 3 days of hydrocodone to use for severe pain or nighttime.  Ibuprofen or Tylenol for mild to moderate pain at a time today.  Dental resource provided. Discussed strict return precautions. Patient verbalized understanding and is agreeable with plan.  Final Clinical Impressions(s) / UC Diagnoses   Final diagnoses:  Pain,  dental     Discharge Instructions     Please use dental resource to contact offices to seek permenant treatment/relief.   Today we have given you an antibiotic-Augmentin. This should help with pain as any infection is cleared.   For pain please take -  of Ibuprofen every 8 hours, take with 1000 mg of Tylenol Extra strength every 8 hours. These  are safe to take together. Please take with food.   I have also provided 3 days worth of stronger pain medication. This should only be used for severe pain. Do not drive or operate machinery while taking this medication.   Please return if you start to experience significant swelling of your face, experiencing fever.     ED Prescriptions    Medication Sig Dispense Auth. Provider   amoxicillin-clavulanate (AUGMENTIN) 875-125 MG tablet Take 1 tablet by mouth every 12 (twelve) hours for 7 days. 14 tablet Bayle Calvo C, PA-C   HYDROcodone-acetaminophen (NORCO/VICODIN) 5-325 MG tablet Take 1 tablet by mouth every 6 (six) hours as needed for up to 3 days. 12 tablet Henlee Donovan C, PA-C   ibuprofen (ADVIL,MOTRIN) 800 MG tablet Take 1 tablet (800 mg total) by mouth 3 (three) times daily. 24 tablet Amberlynn Tempesta, [place] C, PA-C     Controlled Substance Prescriptions [place] Controlled Substance Registry consulted? Yes, I have consulted the [place] Controlled Substances Registry for this patient, and feel the risk/benefit ratio today is favorable for proceeding with this prescription for a controlled substance.   Sharyon Cable Alma C, PA-C 05/27/17 1233

## 2017-06-06 DIAGNOSIS — Z87891 Personal history of nicotine dependence: Secondary | ICD-10-CM | POA: Diagnosis not present

## 2017-06-06 DIAGNOSIS — L509 Urticaria, unspecified: Secondary | ICD-10-CM | POA: Insufficient documentation

## 2017-06-07 ENCOUNTER — Other Ambulatory Visit: Payer: Self-pay

## 2017-06-07 ENCOUNTER — Encounter (HOSPITAL_COMMUNITY): Payer: Self-pay | Admitting: *Deleted

## 2017-06-07 ENCOUNTER — Emergency Department (HOSPITAL_COMMUNITY)
Admission: EM | Admit: 2017-06-07 | Discharge: 2017-06-07 | Disposition: A | Discharge status: Home / Self Care | Payer: Medicaid Other | Attending: Emergency Medicine | Admitting: Emergency Medicine

## 2017-06-07 DIAGNOSIS — L509 Urticaria, unspecified: Secondary | ICD-10-CM

## 2017-06-07 MED ORDER — DIPHENHYDRAMINE HCL 25 MG PO CAPS
25.0000 mg | ORAL_CAPSULE | Freq: Once | ORAL | Status: AC
  Administered 2017-06-07: 25 mg via ORAL
  Filled 2017-06-07: qty 1

## 2017-06-07 MED ORDER — METHYLPREDNISOLONE SODIUM SUCC 125 MG IJ SOLR
INTRAMUSCULAR | Status: AC
  Filled 2017-06-07: qty 2

## 2017-06-07 MED ORDER — FAMOTIDINE IN NACL 20-0.9 MG/50ML-% IV SOLN
20.0000 mg | Freq: Once | INTRAVENOUS | Status: AC
  Administered 2017-06-07: 20 mg via INTRAVENOUS
  Filled 2017-06-07: qty 50

## 2017-06-07 MED ORDER — EPINEPHRINE 0.3 MG/0.3ML IJ SOAJ: 0.3000 mg | Freq: Once | INTRAMUSCULAR | 0 refills | Status: AC

## 2017-06-07 MED ORDER — FAMOTIDINE 20 MG PO TABS: 20.0000 mg | ORAL_TABLET | Freq: Every day | ORAL | 0 refills | Status: DC

## 2017-06-07 MED ORDER — METHYLPREDNISOLONE SODIUM SUCC 125 MG IJ SOLR
125.0000 mg | Freq: Once | INTRAMUSCULAR | Status: AC
Start: 2017-06-07 — End: 2017-06-07
  Administered 2017-06-07: 125 mg via INTRAVENOUS

## 2017-06-07 MED ORDER — PREDNISONE 20 MG PO TABS: 40.0000 mg | ORAL_TABLET | Freq: Every day | ORAL | 0 refills | Status: DC

## 2017-06-07 MED ORDER — DIPHENHYDRAMINE HCL 50 MG/ML IJ SOLN
INTRAMUSCULAR | Status: AC
  Filled 2017-06-07: qty 1

## 2017-06-07 MED ORDER — DIPHENHYDRAMINE HCL 50 MG/ML IJ SOLN
25.0000 mg | Freq: Once | INTRAMUSCULAR | Status: AC
  Administered 2017-06-07: 25 mg via INTRAVENOUS

## 2017-06-07 MED ORDER — DIPHENHYDRAMINE HCL 25 MG PO TABS: 25.0000 mg | ORAL_TABLET | Freq: Four times a day (QID) | ORAL | 0 refills | Status: DC

## 2017-06-07 NOTE — Discharge Instructions (Addendum)
=  You were seen today and likely had an allergic reaction to shellfish or shrimp.  You should avoid these in the future.  Continue to take Benadryl, Pepcid, and prednisone for the next 4 to 5 days.  If you develop worsening symptoms, difficulty breathing, difficulty swallowing you should use your EpiPen and return immediately.

## 2017-06-07 NOTE — ED Triage Notes (Signed)
The pt ate shrimp approx 3 minujtes ago  She started itching 5 minutes after she started eating  Hives over her body some difficulty breathing  Itching  Swollen facial featufres.  She took benadryl  5 mionutes aftyer it started

## 2017-06-07 NOTE — ED Provider Notes (Signed)
[facility] EMERGENCY DEPARTMENT Provider Note   CSN: [id number] Arrival date & time: 06/06/17  2323     History   Chief Complaint Chief Complaint  Patient presents with  . Allergic Reaction    HPI Destiny Wu is a 22 y.o. female.  HPI  This is a 22 year old female who presents with allergic reaction.  Patient reports that she ate shrimp for dinner.  She immediately had hives and swelling over her face, chest, and back.  She states that previously she has tolerated shrimp; however, the last time she ate shrimp she had some itching.  She took Benadryl and that completely resolved.  She denies any throat swelling, nausea, vomiting, shortness of breath.  Onset of symptoms 45 minutes ago.  No other new foods or medications.  Past Medical History:  Diagnosis Date  . History of IUFD 2014  . UTI (urinary tract infection)     Patient Active Problem List   Diagnosis Date Noted  . 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 Eczema 08/16/2016  . Allergic rhinitis 08/16/2016  . Group B streptococcal bacteriuria 07/24/2016  . Supervision of high-risk pregnancy 07/19/2016  . History of stillbirth in currently pregnant patient 07/19/2016    Past Surgical History:  Procedure Laterality Date  . NO PAST SURGERIES       OB History    Gravida  3   Para  2   Term  1   Preterm  1   AB  1   Living  1     SAB  1   TAB      Ectopic      Multiple  0   Live Births  1            Home Medications    Prior to Admission medications   Medication Sig Start Date End Date Taking? Authorizing Provider  diphenhydrAMINE (BENADRYL) 25 MG tablet Take 1 tablet (25 mg total) by mouth every 6 (six) hours. 06/07/17   Loretta Doutt, Mayer Masker, MD  EPINEPHrine 0.3 mg/0.3 mL IJ SOAJ injection Inject 0.3 mLs (0.3 mg total) into the muscle once for 1 dose. 06/07/17 06/07/17  Coulter Oldaker, Mayer Masker, MD  famotidine (PEPCID) 20 MG tablet Take 1 tablet (20  mg total) by mouth daily. 06/07/17   Adaeze Better, Mayer Masker, MD  ibuprofen (ADVIL,MOTRIN) 800 MG tablet Take 1 tablet (800 mg total) by mouth 3 (three) times daily. Patient not taking: Reported on 06/07/2017 05/27/17   Wieters, Hallie C, PA-C  predniSONE (DELTASONE) 20 MG tablet Take 2 tablets (40 mg total) by mouth daily. 06/07/17   Faustino Luecke, Mayer Masker, MD  Prenatal Multivit-Min-Fe-FA (PRENATAL VITAMINS) 0.8 MG tablet Take 1 tablet by mouth daily. Patient not taking: Reported on 06/07/2017 07/06/16   Wamego Bing, MD  triamcinolone ointment (KENALOG) 0.5 % Apply 1 application topically 2 (two) times daily. Patient not taking: Reported on 06/07/2017 11/13/16   Lesly Dukes, MD    Family History Family History  Problem Relation Age of Onset  . Diabetes Maternal Grandmother   . Asthma Maternal Grandmother   . Hypertension Maternal Grandmother   . Asthma Mother   . Asthma Maternal Grandfather   . Diabetes Maternal Grandfather   . Hypertension Maternal Grandfather     Social History Social History   Tobacco Use  . Smoking status: Former Smoker    Packs/day: 0.50    Years: 1.00    Pack years: 0.50    Types: Cigarettes  Last attempt to quit: 01/30/2016    Years since quitting: 1.3  . Smokeless tobacco: Never Used  Substance Use Topics  . Alcohol use: No  . Drug use: No     Allergies   Patient has no known allergies.   Review of Systems Review of Systems  Constitutional: Negative for fever.  Respiratory: Negative for shortness of breath.   Cardiovascular: Negative for chest pain.  Gastrointestinal: Negative for abdominal pain, nausea and vomiting.  Skin: Positive for rash.  All other systems reviewed and are negative.    Physical Exam Updated Vital Signs BP 102/74   Pulse (!) 54   Temp 98.7 F (37.1 C) (Oral)   Resp 19   SpO2 100%   Physical Exam  Constitutional: She is oriented to person, place, and time. She appears well-developed and well-nourished. No  distress.  ABCs intact  HENT:  Head: Normocephalic and atraumatic.  Swelling noted of the bilateral eyes, hives diffusely over the face, no lip swelling or oropharyngeal swelling noted, no trismus, no stridor  Eyes: Pupils are equal, round, and reactive to light.  Cardiovascular: Normal rate, regular rhythm and normal heart sounds.  Pulmonary/Chest: Effort normal and breath sounds normal. No respiratory distress. She has no wheezes.  Abdominal: Soft. There is no tenderness.  Musculoskeletal: She exhibits no edema.  Neurological: She is alert and oriented to person, place, and time.  Skin: Skin is warm and dry.  Rash noted over the face as above, additionally hives noted over the anterior chest and upper back, hives mostly spare extremities  Psychiatric: She has a normal mood and affect.  Nursing note and vitals reviewed.    ED Treatments / Results  Labs (all labs ordered are listed, but only abnormal results are displayed) Labs Reviewed - No data to display  EKG None  Radiology No results found.  Procedures Procedures (including critical care time)  Medications Ordered in ED Medications  diphenhydrAMINE (BENADRYL) injection 25 mg (25 mg Intravenous Given 06/07/17 0009)  famotidine (PEPCID) IVPB 20 mg premix (0 mg Intravenous Stopped 06/07/17 0107)  methylPREDNISolone sodium succinate (SOLU-MEDROL) 125 mg/2 mL injection 125 mg (125 mg Intravenous Given 06/07/17 0010)  diphenhydrAMINE (BENADRYL) capsule 25 mg (25 mg Oral Given 06/07/17 0231)     Initial Impression / Assessment and Plan / ED Course  I have reviewed the triage vital signs and the nursing notes.  Pertinent labs & imaging results that were available during my care of the patient were reviewed by me and considered in my medical decision making (see chart for details).  Clinical Course as of Jun 07 253  Thu Jun 07, 2017  0050 On recheck, patient with improvement.  She continues to have some hives on her face but  much improved as far swelling.  Will reassess.   [CH]  0242 She states she feels much better.  Continues to have some swelling around the orbits but hives have generally resolved on the trunk, back, and face.  Oropharynx continues to be clear.  She was given 1 additional dose of Benadryl.  Will discharge with EpiPen, Benadryl, Pepcid, and prednisone.   [CH]    Clinical Course User Index [CH] Amear Strojny, Mayer Masker, MD    She presents with hives mostly over the face and upper body after eating shrimp.  She has no other signs or symptoms of anaphylaxis.  Her exam is otherwise benign.  Airway is intact and ABCs are reassuring.  Patient was given Benadryl, Solu-Medrol, and Pepcid.  See clinical course above for rechecks.  Patient is requesting discharge.  She continues to have some orbital swelling but hives have defervesced for the most part.  She continues to have an intact airway.  Will discharge home with course of Benadryl, Pepcid, prednisone, and an EpiPen.  After history, exam, and medical workup I feel the patient has been appropriately medically screened and is safe for discharge home. Pertinent diagnoses were discussed with the patient. Patient was given return precautions.   Final Clinical Impressions(s) / ED Diagnoses   Final diagnoses:  Urticaria    ED Discharge Orders        Ordered    diphenhydrAMINE (BENADRYL) 25 MG tablet  Every 6 hours     06/07/17 0254    famotidine (PEPCID) 20 MG tablet  Daily     06/07/17 0254    predniSONE (DELTASONE) 20 MG tablet  Daily     06/07/17 0254    EPINEPHrine 0.3 mg/0.3 mL IJ SOAJ injection   Once     06/07/17 0254       Aolanis Crispen, Mayer Masker, MD 06/07/17 [phone]

## 2017-07-13 ENCOUNTER — Other Ambulatory Visit: Payer: Self-pay

## 2017-07-13 ENCOUNTER — Ambulatory Visit (HOSPITAL_COMMUNITY)
Admission: EM | Admit: 2017-07-13 | Discharge: 2017-07-13 | Disposition: A | Discharge status: Home / Self Care | Payer: Medicaid Other | Attending: Family Medicine | Admitting: Family Medicine

## 2017-07-13 ENCOUNTER — Encounter (HOSPITAL_COMMUNITY): Payer: Self-pay | Admitting: *Deleted

## 2017-07-13 DIAGNOSIS — R1030 Lower abdominal pain, unspecified: Secondary | ICD-10-CM

## 2017-07-13 DIAGNOSIS — Z3201 Encounter for pregnancy test, result positive: Secondary | ICD-10-CM

## 2017-07-13 DIAGNOSIS — R319 Hematuria, unspecified: Secondary | ICD-10-CM | POA: Diagnosis not present

## 2017-07-13 DIAGNOSIS — R102 Pelvic and perineal pain: Secondary | ICD-10-CM | POA: Diagnosis present

## 2017-07-13 LAB — POCT URINALYSIS DIP (DEVICE)
Glucose, UA: NEGATIVE mg/dL
Ketones, ur: 15 mg/dL — AB
Leukocytes, UA: NEGATIVE
Nitrite: NEGATIVE
PH: 6 (ref 5.0–8.0)
PROTEIN: 30 mg/dL — AB
UROBILINOGEN UA: 0.2 mg/dL (ref 0.0–1.0)

## 2017-07-13 LAB — POCT PREGNANCY, URINE: Preg Test, Ur: POSITIVE — AB

## 2017-07-13 NOTE — ED Notes (Signed)
Urine HCG positive for pregnancy.  Dr Delton SeeNelson notified.  States OK to give pt pregnancy result and [facility].  Pt notified & instructed.  Verbalized understanding.

## 2017-07-13 NOTE — ED Triage Notes (Signed)
C/O intermittent sharp pains in rectal area and low suprapubic area since this AM.  C/O discomfort with ambulation. Denies vaginal discharge, but c/o "very light pink when I wiped yesterday" after urinating.

## 2017-07-16 LAB — URINE CYTOLOGY ANCILLARY ONLY
Chlamydia: NEGATIVE
Neisseria Gonorrhea: NEGATIVE
TRICH (WINDOWPATH): NEGATIVE

## 2017-07-17 LAB — URINE CYTOLOGY ANCILLARY ONLY: CANDIDA VAGINITIS: NEGATIVE

## 2017-07-20 ENCOUNTER — Telehealth (HOSPITAL_COMMUNITY): Payer: Self-pay

## 2017-07-20 NOTE — Telephone Encounter (Signed)
Bacterial vaginosis is positive. This was not treated at the urgent care visit.  Attempted to reach patient to make aware and assess need for treatment. No answer.

## 2017-07-22 ENCOUNTER — Inpatient Hospital Stay (HOSPITAL_COMMUNITY)
Admission: AD | Admit: 2017-07-22 | Discharge: 2017-07-22 | Disposition: A | Discharge status: Home / Self Care | Payer: Medicaid Other | Source: Ambulatory Visit | Attending: Obstetrics and Gynecology | Admitting: Obstetrics and Gynecology

## 2017-07-22 ENCOUNTER — Other Ambulatory Visit: Payer: Self-pay

## 2017-07-22 ENCOUNTER — Other Ambulatory Visit: Payer: Self-pay | Admitting: Advanced Practice Midwife

## 2017-07-22 ENCOUNTER — Encounter (HOSPITAL_COMMUNITY): Payer: Self-pay | Admitting: *Deleted

## 2017-07-22 DIAGNOSIS — Z825 Family history of asthma and other chronic lower respiratory diseases: Secondary | ICD-10-CM | POA: Diagnosis not present

## 2017-07-22 DIAGNOSIS — O200 Threatened abortion: Secondary | ICD-10-CM

## 2017-07-22 DIAGNOSIS — F17210 Nicotine dependence, cigarettes, uncomplicated: Secondary | ICD-10-CM | POA: Insufficient documentation

## 2017-07-22 DIAGNOSIS — O039 Complete or unspecified spontaneous abortion without complication: Secondary | ICD-10-CM | POA: Diagnosis present

## 2017-07-22 DIAGNOSIS — O209 Hemorrhage in early pregnancy, unspecified: Secondary | ICD-10-CM

## 2017-07-22 DIAGNOSIS — Z8249 Family history of ischemic heart disease and other diseases of the circulatory system: Secondary | ICD-10-CM | POA: Diagnosis not present

## 2017-07-22 DIAGNOSIS — Z833 Family history of diabetes mellitus: Secondary | ICD-10-CM | POA: Diagnosis not present

## 2017-07-22 LAB — HCG, QUANTITATIVE, PREGNANCY: HCG, BETA CHAIN, QUANT, S: 11 m[IU]/mL — AB (ref <5)

## 2017-07-22 LAB — URINALYSIS, ROUTINE W REFLEX MICROSCOPIC
Bilirubin Urine: NEGATIVE
Glucose, UA: NEGATIVE mg/dL
Hgb urine dipstick: NEGATIVE
Ketones, ur: NEGATIVE mg/dL
LEUKOCYTES UA: NEGATIVE
NITRITE: NEGATIVE
PH: 6 (ref 5.0–8.0)
Protein, ur: NEGATIVE mg/dL
SPECIFIC GRAVITY, URINE: 1.03 (ref 1.005–1.030)

## 2017-07-22 LAB — POCT PREGNANCY, URINE: Preg Test, Ur: NEGATIVE

## 2017-07-22 MED ORDER — METRONIDAZOLE 500 MG PO TABS: 500.0000 mg | ORAL_TABLET | Freq: Two times a day (BID) | ORAL | 0 refills | Status: DC

## 2017-07-22 NOTE — MAU Note (Signed)
Pt was seen in urgent care last week and was told she was pregnant. Pt having having period like bleeding for one week. Bleeding is less today. Pain in R lower abdomen for 4 days. Denies n/v

## 2017-07-22 NOTE — Discharge Instructions (Signed)

## 2017-07-22 NOTE — MAU Note (Signed)
Pt. States she went to went to urgent care last pain due to pain in buttocks. Pain in buttocks was 8/10. Pt. States the pregnancy test was positive. Reports bleeding for week. Pt. States soaking the pad in 3hrs. States blood has now slowed. Pain LRQ started 3 days and is constant and aching and 8/10. Pt. Reports taking ibuprofen when pain first started and it did not help. Denies Vaginal discharge.

## 2017-07-22 NOTE — Progress Notes (Signed)
Harrell LarkQuatera Druscilla BrownieDoniqua Upshaw is a 22 y.o. [phone] nonpregnant patient S/p vaginal bleeding, positive pregnancy test 07/13/2017,  Negative pregnancy test and HCG of 11 today in MAU.  Orders placed for repeat hCG 07/29/2017  Clayton BiblesSamantha Verniece Encarnacion, CNM 07/22/17 11:49 PM

## 2017-07-22 NOTE — MAU Provider Note (Addendum)
History     CSN: [id number]  Arrival date and time: 07/22/17 2117   None     Chief Complaint  Patient presents with  . Vaginal Bleeding   HPI  Destiny Wu is a 22 y.o. [phone] at 4667w4d with pregnancy of unknown location who presents to MAU for vaginal bleeding with cramping x one week. Reports she had a positive pregnancy test at Urgent Care 07/13/2017 then started experiencing heavy vaginal bleeding with clots from approximately 07/14/2017 until yesterday. Now reporting "a little blood" that is dark brown, not foul smelling, each time she wipes. Also reporting RLQ abdominal pain 2-7/10. Unilateral, does not radiate, denies aggravating or alleviating factors. Denies pregnancy symptoms. Denies fever, fall, headache, other concerns  States she was unaware she also tested positive for BV at Urgent Care and so did not pick up prescription. Requesting rx today.  OB History    Gravida  4   Para  2   Term  1   Preterm  1   AB  1   Living  1     SAB  1   TAB      Ectopic      Multiple  0   Live Births  1           Past Medical History:  Diagnosis Date  . UTI (urinary tract infection)     Past Surgical History:  Procedure Laterality Date  . NO PAST SURGERIES      Family History  Problem Relation Age of Onset  . Diabetes Maternal Grandmother   . Asthma Maternal Grandmother   . Hypertension Maternal Grandmother   . Asthma Mother   . Asthma Maternal Grandfather   . Diabetes Maternal Grandfather   . Hypertension Maternal Grandfather     Social History   Tobacco Use  . Smoking status: Current Every Day Smoker    Packs/day: 0.50    Years: 1.00    Pack years: 0.50    Types: Cigarettes    Last attempt to quit: 01/30/2016    Years since quitting: 1.4  . Smokeless tobacco: Never Used  Substance Use Topics  . Alcohol use: No  . Drug use: No    Allergies: No Known Allergies  No medications prior to admission.    Review of Systems   Constitutional: Negative for fatigue and fever.  Gastrointestinal: Positive for abdominal pain. Negative for nausea and vomiting.       RLQ 7/10 since 07/14/2017  Genitourinary: Positive for vaginal bleeding. Negative for difficulty urinating, vaginal discharge and vaginal pain.  Neurological: Negative for dizziness, light-headedness and headaches.   Physical Exam   Blood pressure 116/60, pulse 72, temperature 98.2 F (36.8 C), resp. rate 16, height 5\' 6"  (1.676 m), weight 197 lb (89.4 kg), last menstrual period 06/06/2017, SpO2 99 %, not currently breastfeeding.  Physical Exam  Nursing note and vitals reviewed. Constitutional: She is oriented to person, place, and time. She appears well-developed and well-nourished.  HENT:  Head: Normocephalic.  Cardiovascular: Normal rate, regular rhythm, normal heart sounds and intact distal pulses.  Respiratory: Effort normal and breath sounds normal.  GI: Soft. Bowel sounds are normal. She exhibits no distension and no mass. There is no tenderness. There is no rebound and no guarding.  Genitourinary: Uterus normal. Vaginal discharge found.  Genitourinary Comments: Scant dark brown serosanguinous discharge  Musculoskeletal: Normal range of motion.  Neurological: She is alert and oriented to person, place, and time. She has normal reflexes.  Skin: Skin is warm and dry.  Psychiatric: She has a normal mood and affect. Her behavior is normal. Judgment and thought content normal.    MAU Course  Procedures  MDM Negative pregnancy test today  Patient Vitals for the past 24 hrs:  BP Temp Pulse Resp SpO2 Height Weight  07/22/17 2128 116/60 98.2 F (36.8 C) 72 16 99 % 5\' 6"  (1.676 m) 197 lb (89.4 kg)    Orders Placed This Encounter  Procedures  . US OB LESS THAN 14 WEEKS WITH OB TRANSVAGINAL    Positive pregnancy test last week, bleeding x one week with abdominal pain, negative pregnancy test in MAU today    Standing Status:   Standing     Number of Occurrences:   1    Order Specific Question:   Symptom/Reason for Exam    Answer:   Bleeding in early pregnancy [098119]  . Urinalysis, Routine w reflex microscopic    Standing Status:   Standing    Number of Occurrences:   1  . hCG, quantitative, pregnancy    Standing Status:   Standing    Number of Occurrences:   1  . Pregnancy, urine POC    Standing Status:   Standing    Number of Occurrences:   1  . Discharge patient    Order Specific Question:   Discharge disposition    Answer:   01-Home or Self Care [1]    Order Specific Question:   Discharge patient date    Answer:   07/22/2017   Results for orders placed or performed during the hospital encounter of 07/22/17 (from the past 24 hour(s))  Urinalysis, Routine w reflex microscopic     Status: None   Collection Time: 07/22/17  9:43 PM  Result Value Ref Range   Color, Urine YELLOW YELLOW   APPearance CLEAR CLEAR   Specific Gravity, Urine 1.030 1.005 - 1.030   pH 6.0 5.0 - 8.0   Glucose, UA NEGATIVE NEGATIVE mg/dL   Hgb urine dipstick NEGATIVE NEGATIVE   Bilirubin Urine NEGATIVE NEGATIVE   Ketones, ur NEGATIVE NEGATIVE mg/dL   Protein, ur NEGATIVE NEGATIVE mg/dL   Nitrite NEGATIVE NEGATIVE   Leukocytes, UA NEGATIVE NEGATIVE  Pregnancy, urine POC     Status: None   Collection Time: 07/22/17 10:16 PM  Result Value Ref Range   Preg Test, Ur NEGATIVE NEGATIVE  hCG, quantitative, pregnancy     Status: Abnormal   Collection Time: 07/22/17 10:32 PM  Result Value Ref Range   hCG, Beta Chain, Quant, S 11 (H) <5 mIU/mL    Assessment and Plan  --22 y.o. [id number] nonpregnant patient s/p complete miscarriage --Blood type A positive --600 mg PO Ibuprofen q 6 hours for abdominal cramping PRN --Treat for Flagyl based on wet prep 07/13/2017 and patient confirmation she has not treated  Meds ordered this encounter  Medications  . metroNIDAZOLE (FLAGYL) 500 MG tablet    Sig: Take 1 tablet (500 mg total) by mouth 2 (two) times  daily.    Dispense:  14 tablet    Refill:  0    Order Specific Question:   Supervising Provider    Answer:   Reva Bores [[id number]]   --Reviewed signs of complications that would merit care in ED including but not limited to fever, worsening abdominal pain --Patient to return 07/29/17 at 1800 to redraw hCG, order placed --Discharge home in stable condition.  Calvert Cantor, CNM 07/22/2017, 11:42 PM

## 2017-08-30 ENCOUNTER — Other Ambulatory Visit: Payer: Self-pay

## 2017-08-30 ENCOUNTER — Encounter (HOSPITAL_COMMUNITY): Payer: Self-pay | Admitting: Emergency Medicine

## 2017-08-30 ENCOUNTER — Emergency Department (HOSPITAL_COMMUNITY)
Admission: EM | Admit: 2017-08-30 | Discharge: 2017-08-31 | Disposition: A | Discharge status: Home / Self Care | Payer: Medicaid Other | Attending: Emergency Medicine | Admitting: Emergency Medicine

## 2017-08-30 ENCOUNTER — Emergency Department (HOSPITAL_COMMUNITY): Payer: Medicaid Other

## 2017-08-30 DIAGNOSIS — M79632 Pain in left forearm: Secondary | ICD-10-CM | POA: Diagnosis present

## 2017-08-30 DIAGNOSIS — F17210 Nicotine dependence, cigarettes, uncomplicated: Secondary | ICD-10-CM | POA: Insufficient documentation

## 2017-08-30 MED ORDER — NAPROXEN 500 MG PO TABS: 500.0000 mg | ORAL_TABLET | Freq: Two times a day (BID) | ORAL | 0 refills | Status: DC

## 2017-08-30 MED ORDER — NAPROXEN 250 MG PO TABS
500.0000 mg | ORAL_TABLET | Freq: Once | ORAL | Status: AC
  Administered 2017-08-31: 500 mg via ORAL
  Filled 2017-08-30: qty 2

## 2017-08-30 NOTE — ED Triage Notes (Signed)
Pt states she was awakened this morning about 4a with an ache in her left arm. Works as a Advertising copywriterhousekeeper.  Some worse with movement.

## 2017-08-30 NOTE — Discharge Instructions (Addendum)
Please read and follow all provided instructions.  You have been seen today for pain to your left arm.   Tests performed today include: An x-ray of the affected area - does NOT show any broken bones or dislocations.  Vital signs. See below for your results today.   Home care instructions: -- *PRICE in the first 24-48 hours after injury: Protect (with brace, splint, sling), if given by your provider Rest Ice- Do not apply ice pack directly to your skin, place towel or similar between your skin and ice/ice pack. Apply ice for 20 min, then remove for 40 min while awake Compression- Wear brace, elastic bandage, splint as directed by your provider Elevate affected extremity above the level of your heart when not walking around for the first 24-48 hours   Medications:  - Naproxen is a nonsteroidal anti-inflammatory medication that will help with pain and swelling. Be sure to take this medication as prescribed with food, 1 pill every 12 hours,  It should be taken with food, as it can cause stomach upset, and more seriously, stomach bleeding. Do not take other nonsteroidal anti-inflammatory medications with this such as Advil, Motrin, Aleve, Mobic, Goodie Powder, or Motrin.    You make take Tylenol per over the counter dosing with these medications.   We have prescribed you new medication(s) today. Discuss the medications prescribed today with your pharmacist as they can have adverse effects and interactions with your other medicines including over the counter and prescribed medications. Seek medical evaluation if you start to experience new or abnormal symptoms after taking one of these medicines, seek care immediately if you start to experience difficulty breathing, feeling of your throat closing, facial swelling, or rash as these could be indications of a more serious allergic reaction   Follow-up instructions: Please follow-up with your primary care provider or the provided orthopedic physician  (bone specialist) if you continue to have significant pain in 1 week. In this case you may have a more severe injury that requires further care.   Return instructions:  Please return if your digits or extremity are numb or tingling, appear gray or blue, or you have severe pain (also elevate the extremity and loosen splint or wrap if you were given one) Please return if you have redness or fevers.  Please return to the Emergency Department if you experience worsening symptoms.  Please return if you have any other emergent concerns. Additional Information:  Your vital signs today were: BP 116/68 (BP Location: Right Arm)    Pulse 96    Temp 98.7 F (37.1 C) (Oral)    Resp 18    Ht 5\' 6"  (1.676 m)    Wt 85.7 kg    LMP 06/06/2017 (Approximate)    SpO2 100%    BMI 30.51 kg/m  If your blood pressure (BP) was elevated above 135/85 this visit, please have this repeated by your doctor within one month. ---------------

## 2017-08-30 NOTE — ED Provider Notes (Signed)
[facility] EMERGENCY DEPARTMENT Provider Note   CSN: [id number] Arrival date & time: 08/30/17  2150     History   Chief Complaint Chief Complaint  Patient presents with  . Arm Pain    HPI Destiny Wu is a 22 y.o. female with a hx of tobacco abuse who presents to the ED with complaints of LUE pain that started this morning. Patient states pain is to the dorsum of her L forearm into her L hand. Pain is 10/10 in severity, worse with movement, no alleviating factors, tried excedrin without relief. Reports occasional paraesthesias to the digits 1-4. Denies specific injury. She does use her hands a lot at work as she works as a Advertising copywriter, no significant change in this recently. Denies fever, color change, weakness, numbness, swelling,  recent surgery/trauma, recent long travel, hormone use, personal hx of cancer, or hx of DVT/PE. Denies chance or pregnancy. Patient is R hand dominant.   HPI  Past Medical History:  Diagnosis Date  . UTI (urinary tract infection)     Patient Active Problem List   Diagnosis Date Noted  . 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 Eczema 08/16/2016  . Allergic rhinitis 08/16/2016  . Group B streptococcal bacteriuria 07/24/2016  . Supervision of high-risk pregnancy 07/19/2016  . History of stillbirth in currently pregnant patient 07/19/2016    Past Surgical History:  Procedure Laterality Date  . NO PAST SURGERIES       OB History    Gravida  4   Para  2   Term  1   Preterm  1   AB  1   Living  1     SAB  1   TAB      Ectopic      Multiple  0   Live Births  1            Home Medications    Prior to Admission medications   Medication Sig Start Date End Date Taking? Authorizing Provider  metroNIDAZOLE (FLAGYL) 500 MG tablet Take 1 tablet (500 mg total) by mouth 2 (two) times daily. 07/22/17   Calvert Cantor, CNM    Family History Family History  Problem Relation  Age of Onset  . Diabetes Maternal Grandmother   . Asthma Maternal Grandmother   . Hypertension Maternal Grandmother   . Asthma Mother   . Asthma Maternal Grandfather   . Diabetes Maternal Grandfather   . Hypertension Maternal Grandfather     Social History Social History   Tobacco Use  . Smoking status: Current Every Day Smoker    Packs/day: 0.50    Years: 1.00    Pack years: 0.50    Types: Cigarettes    Last attempt to quit: 01/30/2016    Years since quitting: 1.5  . Smokeless tobacco: Never Used  Substance Use Topics  . Alcohol use: No  . Drug use: No     Allergies   Patient has no known allergies.   Review of Systems Review of Systems  Constitutional: Negative for fever.  Musculoskeletal:       LUE pain  Skin: Negative for color change, rash and wound.  Neurological: Negative for weakness and numbness.       Positive for L finger paresthesias.      Physical Exam Updated Vital Signs BP 116/68 (BP Location: Right Arm)   Pulse 96   Temp 98.7 F (37.1 C) (Oral)   Resp 18   Ht  5\' 6"  (1.676 m)   Wt 85.7 kg   LMP 06/06/2017 (Approximate)   SpO2 100%   BMI 30.51 kg/m   Physical Exam  Constitutional: She appears well-developed and well-nourished. No distress.  HENT:  Head: Normocephalic and atraumatic.  Eyes: Conjunctivae are normal. Right eye exhibits no discharge. Left eye exhibits no discharge.  Musculoskeletal:  Upper extremities: No obvious deformity, appreciable swelling, erythema, ecchymosis, open wounds, or increased warmth.  Patient range of motion intact bilateral shoulders, elbows, wrists, and all digits MCPs and IP joints.  Patient does have some discomfort with left elbow flexion as well as left wrist extension.  She is tender to the left forearm dorsally extending to the dorsum of the hand as well as the digits.  There is not one point/focal area of bony tenderness.  Her compartments are soft.  She is neurovascularly intact distally.  Negative  Tinel's at the median nerve as well as the ulnar nerve.  Negative Phalen's.  Negative Finkelstein.  Neurological: She is alert.  Clear speech.  Sensation grossly intact bilateral upper extremities.  5 out of 5 symmetric grip strength.  Able to perform okay sign, thumbs up, and cross second and third digits bilaterally.  Skin: Skin is warm and dry. Capillary refill takes less than 2 seconds.  Psychiatric: She has a normal mood and affect. Her behavior is normal. Thought content normal.  Nursing note and vitals reviewed.    ED Treatments / Results  Labs (all labs ordered are listed, but only abnormal results are displayed) Labs Reviewed - No data to display  EKG EKG Interpretation  Date/Time:  Thursday August 30 2017 22:13:18 EDT Ventricular Rate:  94 PR Interval:  134 QRS Duration: 80 QT Interval:  348 QTC Calculation: 435 R Axis:   67 Text Interpretation:  Normal sinus rhythm Normal ECG When compared with ECG of 12/17/2015, No significant change was found Confirmed by Dione BoozeGlick, David ([id number]) on 08/30/2017 11:07:41 PM   Radiology Dg Forearm Left  Result Date: 08/30/2017 CLINICAL DATA:  Left arm pain this morning. EXAM: LEFT FOREARM - 2 VIEW COMPARISON:  None. FINDINGS: There is no evidence of fracture or other focal bone lesions. No joint dislocations. Soft tissues are unremarkable. IMPRESSION: No acute osseous abnormality. Electronically Signed   By: Tollie Ethavid  Kwon M.D.   On: 08/30/2017 23:11    Procedures Procedures (including critical care time) .Splint Application Date/Time: 08/30/2017 11:30 PM Performed by: EDT Authorized by: Cherly AndersonPetrucelli, Samantha R, PA-C   Consent:    Consent obtained:  Verbal   Consent given by:  Patient   Risks discussed:  Discoloration, numbness, pain and swelling   Alternatives discussed:  No treatment Pre-procedure details:    Sensation:  Normal Procedure details:    Laterality:  Left   Location:  Wrist   Wrist:  L wrist   Splint type:   Wrist Post-procedure details:    Sensation:  Normal   Patient tolerance of procedure:  Tolerated well, no immediate complications Medications Ordered in ED Medications  naproxen (NAPROSYN) tablet 500 mg (has no administration in time range)     Initial Impression / Assessment and Plan / ED Course  I have reviewed the triage vital signs and the nursing notes.  Pertinent labs & imaging results that were available during my care of the patient were reviewed by me and considered in my medical decision making (see chart for details).   Patient presents to the emergency department with atraumatic left upper extremity pain to the  forearm radiating distally.  Patient nontoxic-appearing, no apparent distress, vitals WNL.  X-ray per triage negative for fracture or dislocation.  Patient's compartments are soft, able to passively ROM, do not suspect compartment syndrome.  She has no swelling or risk factors for DVT therefore doubtful of this diagnosis.  No overlying erythema or warmth or fevers to raise concern for infectious etiology.  Suspect muscle strain/spasm, however unclear definitive etiology.  PRICE and naproxen with PCP follow-up. I discussed results, treatment plan, need for PCP follow-up, and return precautions with the patient. Provided opportunity for questions, patient confirmed understanding and is in agreement with plan.   EKG per triage due to L arm pain, do not feel this was necessary, LUE pain appears MSK in origin as opposed to cardiac- patient has no chest pain, dyspnea, lightheadedness, dizziness, near syncope/syncope.   Final Clinical Impressions(s) / ED Diagnoses   Final diagnoses:  Pain of left forearm    ED Discharge Orders         Ordered    naproxen (NAPROSYN) 500 MG tablet  2 times daily     08/30/17 2331           Cherly Anderson, PA-C 08/30/17 2334    Azalia Bilis, MD 08/31/17 [phone]

## 2017-09-12 ENCOUNTER — Ambulatory Visit (INDEPENDENT_AMBULATORY_CARE_PROVIDER_SITE_OTHER): Payer: Medicaid Other

## 2017-09-12 DIAGNOSIS — Z111 Encounter for screening for respiratory tuberculosis: Secondary | ICD-10-CM | POA: Diagnosis present

## 2017-09-12 MED ORDER — TUBERCULIN PPD 5 UNIT/0.1ML ID SOLN
5.0000 [IU] | Freq: Once | INTRADERMAL | Status: AC
  Administered 2017-09-12: 5 [IU] via INTRADERMAL

## 2017-09-12 NOTE — Progress Notes (Signed)
I have reviewed this chart and agree with the RN/CMA assessment and management.    K. Meryl Syna Gad, M.D. [facility]  

## 2017-09-12 NOTE — Progress Notes (Signed)
Pt here today for TB Skin test.  Administered in R forearm.  Pt tolerated well.  Pt advised to come in on Friday for reading.  Pt stated understanding.

## 2017-10-01 ENCOUNTER — Ambulatory Visit (INDEPENDENT_AMBULATORY_CARE_PROVIDER_SITE_OTHER): Payer: Medicaid Other | Admitting: General Practice

## 2017-10-01 VITALS — BP 119/70 | HR 115 | Ht 66.0 in | Wt 190.0 lb

## 2017-10-01 DIAGNOSIS — Z111 Encounter for screening for respiratory tuberculosis: Secondary | ICD-10-CM

## 2017-10-01 DIAGNOSIS — Z23 Encounter for immunization: Secondary | ICD-10-CM | POA: Diagnosis present

## 2017-10-01 MED ORDER — TUBERCULIN PPD 5 UNIT/0.1ML ID SOLN
5.0000 [IU] | Freq: Once | INTRADERMAL | Status: AC
  Administered 2017-10-01: 5 [IU] via INTRADERMAL

## 2017-10-01 NOTE — Progress Notes (Signed)
I have reviewed the chart and agree with nursing staff's documentation of this patient's encounter.  Melika Reder, CNM 10/01/2017 4:01 PM    

## 2017-10-01 NOTE — Progress Notes (Signed)
Destiny Wu here for Flu Vaccine  & TB Skin Test. Patient forgot to return to office for ppd reading several weeks ago.  Injections administered without complication. Patient will return Thursday for tb skin test reading.  Marylynn Pearson, RN 10/01/2017  1:59 PM

## 2017-10-03 ENCOUNTER — Encounter: Payer: Self-pay | Admitting: *Deleted

## 2017-10-03 ENCOUNTER — Ambulatory Visit (INDEPENDENT_AMBULATORY_CARE_PROVIDER_SITE_OTHER): Payer: Medicaid Other | Admitting: *Deleted

## 2017-10-03 DIAGNOSIS — Z111 Encounter for screening for respiratory tuberculosis: Secondary | ICD-10-CM

## 2017-10-03 NOTE — Progress Notes (Signed)
Here for tb test reading which was negative, only pinkness with no induration 28mm. Letter given

## 2017-10-05 NOTE — Progress Notes (Signed)
I have reviewed the chart and agree with nursing staff's documentation of this patient's encounter.  Thressa ShellerHeather Hogan, CNM 10/05/2017 8:05 AM

## 2017-12-27 ENCOUNTER — Encounter (HOSPITAL_COMMUNITY): Payer: Self-pay

## 2017-12-27 ENCOUNTER — Ambulatory Visit (HOSPITAL_COMMUNITY)
Admission: EM | Admit: 2017-12-27 | Discharge: 2017-12-27 | Disposition: A | Discharge status: Home / Self Care | Payer: Medicaid Other | Attending: Nurse Practitioner | Admitting: Nurse Practitioner

## 2017-12-27 ENCOUNTER — Other Ambulatory Visit: Payer: Self-pay

## 2017-12-27 DIAGNOSIS — J069 Acute upper respiratory infection, unspecified: Secondary | ICD-10-CM

## 2017-12-27 DIAGNOSIS — B9789 Other viral agents as the cause of diseases classified elsewhere: Secondary | ICD-10-CM | POA: Diagnosis not present

## 2017-12-27 MED ORDER — PREDNISONE 20 MG PO TABS: 40.0000 mg | ORAL_TABLET | Freq: Every day | ORAL | 0 refills | Status: AC

## 2017-12-27 NOTE — ED Provider Notes (Signed)
[facility]    CSN: [id number] Arrival date & time: 12/27/17  0807     History   Chief Complaint Chief Complaint  Patient presents with  . Cough    HPI Destiny Wu is a 22 y.o. female.   Subjective:   History was provided by the patient.  Destiny Wu is a 23 y.o. female who presents for evaluation of symptoms of a URI. Symptoms include chest congestion, dry cough and sore throat. Onset of symptoms was 2 days ago, unchanged since that time. She denies any fever, shortness of breath or wheezing.  She is drinking plenty of fluids. Evaluation to date: none. Treatment to date: none  The following portions of the patient's history were reviewed and updated as appropriate: allergies, current medications, past family history, past medical history, past social history, past surgical history and problem list.        Past Medical History:  Diagnosis Date  . UTI (urinary tract infection)     Patient Active Problem List   Diagnosis Date Noted  . 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 Eczema 08/16/2016  . Allergic rhinitis 08/16/2016  . Group B streptococcal bacteriuria 07/24/2016  . Supervision of high-risk pregnancy 07/19/2016  . History of stillbirth in currently pregnant patient 07/19/2016    Past Surgical History:  Procedure Laterality Date  . NO PAST SURGERIES      OB History    Gravida  4   Para  2   Term  1   Preterm  1   AB  1   Living  1     SAB  1   TAB      Ectopic      Multiple  0   Live Births  1            Home Medications    Prior to Admission medications   Medication Sig Start Date End Date Taking? Authorizing Provider  predniSONE (DELTASONE) 20 MG tablet Take 2 tablets (40 mg total) by mouth daily for 5 days. 12/27/17 01/01/18  Lurline Idol, FNP    Family History Family History  Problem Relation Age of Onset  . Diabetes Maternal Grandmother   . Asthma Maternal  Grandmother   . Hypertension Maternal Grandmother   . Asthma Mother   . Asthma Maternal Grandfather   . Diabetes Maternal Grandfather   . Hypertension Maternal Grandfather     Social History Social History   Tobacco Use  . Smoking status: Current Every Day Smoker    Packs/day: 0.50    Years: 1.00    Pack years: 0.50    Types: Cigarettes    Last attempt to quit: 01/30/2016    Years since quitting: 1.9  . Smokeless tobacco: Never Used  Substance Use Topics  . Alcohol use: No  . Drug use: No     Allergies   Patient has no known allergies.   Review of Systems Review of Systems  Constitutional: Negative for fever.  HENT: Positive for congestion, rhinorrhea and sneezing. Negative for ear pain and sore throat.   Eyes: Negative.   Respiratory: Positive for cough. Negative for shortness of breath and wheezing.   Cardiovascular: Negative.   Gastrointestinal: Negative.   Neurological: Positive for headaches.  All other systems reviewed and are negative.    Physical Exam Triage Vital Signs ED Triage Vitals [12/27/17 0825]  Enc Vitals Group     BP      Pulse  Resp      Temp      Temp src      SpO2      Weight 170 lb (77.1 kg)     Height      Head Circumference      Peak Flow      Pain Score 6     Pain Loc      Pain Edu?      Excl. in GC?    No data found.  Updated Vital Signs Wt 170 lb (77.1 kg)   LMP 12/11/2017   Breastfeeding? Unknown   BMI 27.44 kg/m   Visual Acuity Right Eye Distance:   Left Eye Distance:   Bilateral Distance:    Right Eye Near:   Left Eye Near:    Bilateral Near:     Physical Exam  Constitutional: She is oriented to person, place, and time. She appears well-developed and well-nourished.  HENT:  Head: Normocephalic.  Right Ear: External ear normal.  Left Ear: External ear normal.  Nose: Nose normal.  Mouth/Throat: Oropharynx is clear and moist.  Eyes: Pupils are equal, round, and reactive to light. Conjunctivae and EOM  are normal. Right eye exhibits no discharge. Left eye exhibits no discharge.  Neck: Normal range of motion.  Cardiovascular: Normal rate, regular rhythm and normal heart sounds.  Pulmonary/Chest: Effort normal and breath sounds normal.  Musculoskeletal: Normal range of motion.  Lymphadenopathy:    She has no cervical adenopathy.  Neurological: She is alert and oriented to person, place, and time.  Skin: Skin is warm and dry.  Psychiatric: She has a normal mood and affect.     UC Treatments / Results  Labs (all labs ordered are listed, but only abnormal results are displayed) Labs Reviewed - No data to display  EKG None  Radiology No results found.  Procedures Procedures (including critical care time)  Medications Ordered in UC Medications - No data to display  Initial Impression / Assessment and Plan / UC Course  I have reviewed the triage vital signs and the nursing notes.  Pertinent labs & imaging results that were available during my care of the patient were reviewed by me and considered in my medical decision making (see chart for details).     22 year old female presenting with a 2-day history of URI.   Plan:  Discussed diagnosis and treatment of URI. Discussed the importance of avoiding unnecessary antibiotic therapy. Suggested symptomatic OTC remedies. Nasal saline spray for congestion. Follow up as needed.  Today's evaluation has revealed no signs of a dangerous process. Discussed diagnosis with patient. Patient aware of their diagnosis, possible red flag symptoms to watch out for and need for close follow up. Patient understands verbal and written discharge instructions. Patient comfortable with plan and disposition.  Patient has a clear mental status at this time, good insight into illness (after discussion and teaching) and has clear judgment to make decisions regarding their care.  Documentation was completed with the aid of voice recognition software.  Transcription may contain typographical errors. Final Clinical Impressions(s) / UC Diagnoses   Final diagnoses:  Viral URI with cough     Discharge Instructions     Take medications as prescribed. You may also take mucinex DM 12-hour formula twice daily for cough. Tylenol and/or ibuprofen as needed for fevers or pain. Drink plenty of fluids. Follow-up with your primary care provider as needed.     ED Prescriptions    Medication Sig Dispense Auth. Provider  predniSONE (DELTASONE) 20 MG tablet Take 2 tablets (40 mg total) by mouth daily for 5 days. 10 tablet Wynne Rozak, FNP     ConLurline Idoltrolled Substance Prescriptions [place] Controlled Substance Registry consulted? Not Applicable   Lurline IdolMurrill, Kagan Hietpas, [place]NP 12/27/17 [phone]

## 2017-12-27 NOTE — ED Triage Notes (Signed)
Pt cc pt states she has been coughing and has congestion. X 2 days

## 2017-12-27 NOTE — Discharge Instructions (Addendum)
Take medications as prescribed. You may also take mucinex DM 12-hour formula twice daily for cough. Tylenol and/or ibuprofen as needed for fevers or pain. Drink plenty of fluids. Follow-up with your primary care provider as needed.

## 2018-04-13 ENCOUNTER — Other Ambulatory Visit: Payer: Self-pay

## 2018-04-13 ENCOUNTER — Inpatient Hospital Stay (HOSPITAL_COMMUNITY): Payer: Medicaid Other

## 2018-04-13 ENCOUNTER — Inpatient Hospital Stay (HOSPITAL_COMMUNITY)
Admission: AD | Admit: 2018-04-13 | Discharge: 2018-04-13 | Disposition: A | Discharge status: Home / Self Care | Payer: Medicaid Other | Attending: Obstetrics and Gynecology | Admitting: Obstetrics and Gynecology

## 2018-04-13 ENCOUNTER — Encounter (HOSPITAL_COMMUNITY): Payer: Self-pay

## 2018-04-13 DIAGNOSIS — O99331 Smoking (tobacco) complicating pregnancy, first trimester: Secondary | ICD-10-CM | POA: Insufficient documentation

## 2018-04-13 DIAGNOSIS — B9689 Other specified bacterial agents as the cause of diseases classified elsewhere: Secondary | ICD-10-CM

## 2018-04-13 DIAGNOSIS — Z3A09 9 weeks gestation of pregnancy: Secondary | ICD-10-CM | POA: Diagnosis not present

## 2018-04-13 DIAGNOSIS — F17210 Nicotine dependence, cigarettes, uncomplicated: Secondary | ICD-10-CM | POA: Insufficient documentation

## 2018-04-13 DIAGNOSIS — N760 Acute vaginitis: Secondary | ICD-10-CM

## 2018-04-13 DIAGNOSIS — O23591 Infection of other part of genital tract in pregnancy, first trimester: Secondary | ICD-10-CM | POA: Insufficient documentation

## 2018-04-13 DIAGNOSIS — O209 Hemorrhage in early pregnancy, unspecified: Secondary | ICD-10-CM

## 2018-04-13 DIAGNOSIS — Z833 Family history of diabetes mellitus: Secondary | ICD-10-CM | POA: Diagnosis not present

## 2018-04-13 LAB — WET PREP, GENITAL
SPERM: NONE SEEN
Trich, Wet Prep: NONE SEEN
Yeast Wet Prep HPF POC: NONE SEEN

## 2018-04-13 LAB — HCG, QUANTITATIVE, PREGNANCY: HCG, BETA CHAIN, QUANT, S: 50092 m[IU]/mL — AB (ref <5)

## 2018-04-13 LAB — CBC
HCT: 36.7 % (ref 36.0–46.0)
HEMOGLOBIN: 12.3 g/dL (ref 12.0–15.0)
MCH: 31 pg (ref 26.0–34.0)
MCHC: 33.5 g/dL (ref 30.0–36.0)
MCV: 92.4 fL (ref 80.0–100.0)
NRBC: 0 % (ref 0.0–0.2)
Platelets: 284 10*3/uL (ref 150–400)
RBC: 3.97 MIL/uL (ref 3.87–5.11)
RDW: 13.2 % (ref 11.5–15.5)
WBC: 7.3 10*3/uL (ref 4.0–10.5)

## 2018-04-13 LAB — POCT PREGNANCY, URINE: PREG TEST UR: POSITIVE — AB

## 2018-04-13 MED ORDER — METRONIDAZOLE 500 MG PO TABS: 500.0000 mg | ORAL_TABLET | Freq: Two times a day (BID) | ORAL | 0 refills | Status: DC

## 2018-04-13 NOTE — MAU Note (Addendum)
Pt had positive HPT, started spotting a couple of days ago and having cramping. Pain 5/10. LMP 02/06/18 approx Pt states she doesn't want to keep the pregnancy and wanted to know if we could give her something. I explained that we don't do abortions here and she would have to find a facility that performs them, we also don't refer out for that.

## 2018-04-13 NOTE — Discharge Instructions (Signed)
Seek prenatal care or be seen at [facility].   Bacterial Vaginosis  Bacterial vaginosis is an infection of the vagina. It happens when too many normal germs (healthy bacteria) grow in the vagina. This infection puts you at risk for infections from sex (STIs). Treating this infection can lower your risk for some STIs. You should also treat this if you are pregnant. It can cause your baby to be born early. Follow these instructions at home: Medicines  Take over-the-counter and prescription medicines only as told by your doctor.  Take or use your antibiotic medicine as told by your doctor. Do not stop taking or using it even if you start to feel better. General instructions  If you your sexual partner is a woman, tell her that you have this infection. She needs to get treatment if she has symptoms. If you have a female partner, he does not need to be treated.  During treatment: ? Avoid sex. ? Do not douche. ? Avoid alcohol as told. ? Avoid breastfeeding as told.  Drink enough fluid to keep your pee (urine) clear or pale yellow.  Keep your vagina and butt (rectum) clean. ? Wash the area with warm water every day. ? Wipe from front to back after you use the toilet.  Keep all follow-up visits as told by your doctor. This is important. Preventing this condition  Do not douche.  Use only warm water to wash around your vagina.  Use protection when you have sex. This includes: ? Latex condoms. ? Dental dams.  Limit how many people you have sex with. It is best to only have sex with the same person (be monogamous).  Get tested for STIs. Have your partner get tested.  Wear underwear that is cotton or lined with cotton.  Avoid tight pants and pantyhose. This is most important in summer.  Do not use any products that have nicotine or tobacco in them. These include cigarettes and e-cigarettes. If you need help quitting, ask your doctor.  Do not use illegal drugs.  Limit how  much alcohol you drink. Contact a doctor if:  Your symptoms do not get better, even after you are treated.  You have more discharge or pain when you pee (urinate).  You have a fever.  You have pain in your belly (abdomen).  You have pain with sex.  Your bleed from your vagina between periods. Summary  This infection happens when too many germs (bacteria) grow in the vagina.  Treating this condition can lower your risk for some infections from sex (STIs).  You should also treat this if you are pregnant. It can cause early (premature) birth.  Do not stop taking or using your antibiotic medicine even if you start to feel better. This information is not intended to replace advice given to you by your health care provider. Make sure you discuss any questions you have with your health care provider. Document Released: 10/19/2007 Document Revised: 09/25/2015 Document Reviewed: 09/25/2015 Elsevier Interactive Patient Education  2019 [employer].

## 2018-04-13 NOTE — MAU Provider Note (Signed)
History     CSN: [id number]  Arrival date and time: 04/13/18 1053   First Provider Initiated Contact with Patient 04/13/18 1206      No chief complaint on file. Bleeding and cramping.  HPI Destiny Wu 23 y.o. [redacted]w[redacted]d Client has had 3 days of vaginal bleeding and cramping.  It is less today.  Positive pregnancy test in MAU today.  LMP was Jan 15 (not a firm date but best client could remember).  Discussed with client that location of the pregnancy is not certain and we would do a workup to determine the location of the pregnancy given that she is having bleeding and cramping.     OB History    Gravida  5   Para  2   Term  1   Preterm  1   AB  1   Living  1     SAB  1   TAB      Ectopic      Multiple  0   Live Births  1           Past Medical History:  Diagnosis Date  . UTI (urinary tract infection)     Past Surgical History:  Procedure Laterality Date  . NO PAST SURGERIES      Family History  Problem Relation Age of Onset  . Diabetes Maternal Grandmother   . Asthma Maternal Grandmother   . Hypertension Maternal Grandmother   . Asthma Mother   . Asthma Maternal Grandfather   . Diabetes Maternal Grandfather   . Hypertension Maternal Grandfather     Social History   Tobacco Use  . Smoking status: Current Every Day Smoker    Packs/day: 0.50    Years: 1.00    Pack years: 0.50    Types: Cigarettes    Last attempt to quit: 01/30/2016    Years since quitting: 2.2  . Smokeless tobacco: Never Used  Substance Use Topics  . Alcohol use: No  . Drug use: No    Allergies: No Known Allergies  No medications prior to admission.    Review of Systems  Constitutional: Negative for fever.  HENT: Negative for congestion.   Respiratory: Negative for cough and shortness of breath.   Gastrointestinal: Positive for abdominal pain.  Genitourinary: Positive for vaginal bleeding. Negative for dysuria and vaginal discharge.   Physical Exam    Blood pressure 108/60, pulse 77, temperature 98.3 F (36.8 C), temperature source Oral, resp. rate 16, height 5\' 6"  (1.676 m), weight 86.3 kg, last menstrual period 02/06/2018, SpO2 100 %, unknown if currently breastfeeding.  Physical Exam  Nursing note and vitals reviewed. Constitutional: She is oriented to person, place, and time. She appears well-developed and well-nourished.  HENT:  Head: Normocephalic.  Eyes: EOM are normal.  Neck: Neck supple.  GI: Soft. There is no abdominal tenderness. There is no rebound and no guarding.  Genitourinary:    Genitourinary Comments: Speculum exam: Vagina - Small amount of clear discharge, no odor, no blood seen Cervix - No contact bleeding Bimanual exam: Cervix closed Uterus non tender, 6 week size Adnexa non tender, no masses bilaterally GC/Chlam, wet prep done Chaperone present for exam.    Musculoskeletal: Normal range of motion.  Neurological: She is alert and oriented to person, place, and time.  Skin: Skin is warm and dry.  Psychiatric: She has a normal mood and affect.    MAU Course  Procedures Results for orders placed or performed during the hospital  encounter of 04/13/18 (from the past 24 hour(s))  Pregnancy, urine POC     Status: Abnormal   Collection Time: 04/13/18 11:55 AM  Result Value Ref Range   Preg Test, Ur POSITIVE (A) NEGATIVE  CBC     Status: None   Collection Time: 04/13/18 12:29 PM  Result Value Ref Range   WBC 7.3 4.0 - 10.5 K/uL   RBC 3.97 3.87 - 5.11 MIL/uL   Hemoglobin 12.3 12.0 - 15.0 g/dL   HCT 72.0 72.1 - 82.8 %   MCV 92.4 80.0 - 100.0 fL   MCH 31.0 26.0 - 34.0 pg   MCHC 33.5 30.0 - 36.0 g/dL   RDW 83.3 74.4 - 51.4 %   Platelets 284 150 - 400 K/uL   nRBC 0.0 0.0 - 0.2 %  Wet prep, genital     Status: Abnormal   Collection Time: 04/13/18 12:36 PM  Result Value Ref Range   Yeast Wet Prep HPF POC NONE SEEN NONE SEEN   Trich, Wet Prep NONE SEEN NONE SEEN   Clue Cells Wet Prep HPF POC PRESENT (A)  NONE SEEN   WBC, Wet Prep HPF POC FEW (A) NONE SEEN   Sperm NONE SEEN     MDM Ultrasound shows IUP with small subchorionic hemorrhage which is likely causing the cramping and bleeding that she has been having. Bacterial vaginosis diagnosed by wet prep. Client stated today she anticipates a termination so will treat for BV.  Advised to seek prenatal care or see [facility] according to her personal plans.  Assessment and Plan  Bacterial vaginosis IUP at 9 weeks with small subchorionic hemorrhage  Plan Metronidazole 500 mg PO bid x 7 days Follow up with care for this pregnancy.   Destiny Wu L Daejon Lich 04/13/2018, 12:47 PM

## 2018-04-14 LAB — RPR: RPR Ser Ql: NONREACTIVE

## 2018-04-14 LAB — HIV ANTIBODY (ROUTINE TESTING W REFLEX): HIV SCREEN 4TH GENERATION: NONREACTIVE

## 2018-04-15 LAB — GC/CHLAMYDIA PROBE AMP (~~LOC~~) NOT AT ARMC
CHLAMYDIA, DNA PROBE: NEGATIVE
Neisseria Gonorrhea: NEGATIVE

## 2018-12-22 ENCOUNTER — Encounter (HOSPITAL_COMMUNITY): Payer: Self-pay | Admitting: Family Medicine

## 2018-12-22 ENCOUNTER — Other Ambulatory Visit: Payer: Self-pay

## 2018-12-22 ENCOUNTER — Ambulatory Visit (HOSPITAL_COMMUNITY)
Admission: EM | Admit: 2018-12-22 | Discharge: 2018-12-22 | Disposition: A | Discharge status: Home / Self Care | Payer: Medicaid Other | Attending: Family Medicine | Admitting: Family Medicine

## 2018-12-22 DIAGNOSIS — Z3202 Encounter for pregnancy test, result negative: Secondary | ICD-10-CM | POA: Diagnosis not present

## 2018-12-22 DIAGNOSIS — R109 Unspecified abdominal pain: Secondary | ICD-10-CM | POA: Diagnosis not present

## 2018-12-22 DIAGNOSIS — R1030 Lower abdominal pain, unspecified: Secondary | ICD-10-CM | POA: Diagnosis not present

## 2018-12-22 LAB — POCT URINALYSIS DIP (DEVICE)
Bilirubin Urine: NEGATIVE
Bilirubin Urine: NEGATIVE
Glucose, UA: NEGATIVE mg/dL
Glucose, UA: NEGATIVE mg/dL
Hgb urine dipstick: NEGATIVE
Ketones, ur: NEGATIVE mg/dL
Leukocytes,Ua: NEGATIVE
Leukocytes,Ua: NEGATIVE
Nitrite: NEGATIVE
Nitrite: NEGATIVE
Protein, ur: >=300 mg/dL — AB
Protein, ur: NEGATIVE mg/dL
Specific Gravity, Urine: >=1.03 (ref 1.005–1.030)
Specific Gravity, Urine: >=1.03 (ref 1.005–1.030)
Urobilinogen, UA: 0.2 mg/dL (ref 0.0–1.0)
Urobilinogen, UA: 0.2 mg/dL (ref 0.0–1.0)
pH: 5.5 (ref 5.0–8.0)
pH: 6 (ref 5.0–8.0)

## 2018-12-22 LAB — POCT PREGNANCY, URINE: Preg Test, Ur: NEGATIVE

## 2018-12-22 LAB — POC URINE PREG, ED: Preg Test, Ur: NEGATIVE

## 2018-12-22 NOTE — ED Provider Notes (Signed)
[facility]    CSN: [id number] Arrival date & time: 12/22/18  1745      History   Chief Complaint Chief Complaint  Patient presents with  . lower abd cramping    HPI Destiny Wu is a 23 y.o. female.   This is 23 year old established most [facility] urgent care patient who presents today with abdominal pain. She  c/o lower abdominal cramping since 2 days ago. Denies fevers, vaginal discharge. Pt reports headache x2 days.  Thinks she may have upset stomach from Thanksgiving.  LMP 13 days ago.  No spotting, diarrhea, vomiting.  Maybe has slight nausea.  No fever,chills, loss of smell sense.     Past Medical History:  Diagnosis Date  . UTI (urinary tract infection)     Patient Active Problem List   Diagnosis Date Noted  . 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 Eczema 08/16/2016  . Allergic rhinitis 08/16/2016  . Group B streptococcal bacteriuria 07/24/2016  . Supervision of high-risk pregnancy 07/19/2016  . History of stillbirth in currently pregnant patient 07/19/2016    Past Surgical History:  Procedure Laterality Date  . NO PAST SURGERIES      OB History    Gravida  5   Para  2   Term  1   Preterm  1   AB  1   Living  1     SAB  1   TAB      Ectopic      Multiple  0   Live Births  1            Home Medications    Prior to Admission medications   Medication Sig Start Date End Date Taking? Authorizing Provider  triamcinolone (KENALOG) 0.025 % cream Apply 1 application topically 2 (two) times daily.   Yes [provider]    Family History Family History  Problem Relation Age of Onset  . Diabetes Maternal Grandmother   . Asthma Maternal Grandmother   . Hypertension Maternal Grandmother   . Asthma Mother   . Asthma Maternal Grandfather   . Diabetes Maternal Grandfather   . Hypertension Maternal Grandfather     Social History Social History   Tobacco Use  . Smoking status:  Current Every Day Smoker    Packs/day: 0.50    Years: 1.00    Pack years: 0.50    Types: Cigarettes    Last attempt to quit: 01/30/2016    Years since quitting: 2.8  . Smokeless tobacco: Never Used  Substance Use Topics  . Alcohol use: No  . Drug use: No     Allergies   Patient has no known allergies.   Review of Systems Review of Systems   Physical Exam Triage Vital Signs ED Triage Vitals [12/22/18 1756]  Enc Vitals Group     BP 116/76     Pulse Rate (!) 54     Resp 16     Temp 97.8 F (36.6 C)     Temp Source Oral     SpO2 97 %     Weight      Height      Head Circumference      Peak Flow      Pain Score      Pain Loc      Pain Edu?      Excl. in GC?    No data found.  Updated Vital Signs BP 116/76 (BP Location: Left Arm)  Pulse (!) 54   Temp 97.8 F (36.6 C) (Oral)   Resp 16   LMP 02/07/2018   SpO2 97%   Breastfeeding Unknown   Physical Exam Vitals signs and nursing note reviewed.  Constitutional:      General: She is not in acute distress.    Appearance: Normal appearance. She is obese. She is not ill-appearing.  HENT:     Head: Normocephalic.  Eyes:     Conjunctiva/sclera: Conjunctivae normal.  Neck:     Musculoskeletal: Normal range of motion and neck supple.  Cardiovascular:     Rate and Rhythm: Normal rate and regular rhythm.  Pulmonary:     Effort: Pulmonary effort is normal.     Breath sounds: Normal breath sounds.  Abdominal:     General: Bowel sounds are normal.     Palpations: Abdomen is soft. There is no mass.     Tenderness: There is abdominal tenderness. There is no guarding or rebound.     Comments: Tender suprapubic area with deep palpation.  Musculoskeletal: Normal range of motion.  Skin:    General: Skin is warm and dry.  Neurological:     General: No focal deficit present.     Mental Status: She is alert.  Psychiatric:        Mood and Affect: Mood normal.      UC Treatments / Results  Labs (all labs ordered  are listed, but only abnormal results are displayed) Labs Reviewed  POCT PREGNANCY, URINE  POC URINE PREG, ED  POCT URINALYSIS DIP (DEVICE)  all negative    Initial Impression / Assessment and Plan / UC Course  I have reviewed the triage vital signs and the nursing notes.  Pertinent labs & imaging results that were available during my care of the patient were reviewed by me and considered in my medical decision making (see chart for details).    Final Clinical Impressions(s) / UC Diagnoses   Final diagnoses:  Abdominal cramps     Discharge Instructions     The urine tests are negative.  At this point, I am presuming that the problem has to do with what you ate on Thanksgiving.  Stay on liquid diet for next 24 hours.  Return if cramps do not subside, the pain worsens, or you run a fever.    ED Prescriptions    None     I have reviewed the PDMP during this encounter.   Robyn Haber, MD 12/22/18 1826

## 2018-12-22 NOTE — ED Triage Notes (Signed)
Pt presents to UC w/ c/o lower abdominal cramping since 2 days ago. Denies fevers, vaginal discharge. Pt reports headache x2 days.

## 2018-12-22 NOTE — Discharge Instructions (Addendum)
The urine tests are negative.  At this point, I am presuming that the problem has to do with what you ate on Thanksgiving.  Stay on liquid diet for next 24 hours.  Return if cramps do not subside, the pain worsens, or you run a fever.

## 2019-07-11 ENCOUNTER — Emergency Department (HOSPITAL_COMMUNITY)
Admission: EM | Admit: 2019-07-11 | Discharge: 2019-07-12 | Disposition: A | Discharge status: Home / Self Care | Payer: Medicaid Other | Attending: Emergency Medicine | Admitting: Emergency Medicine

## 2019-07-11 DIAGNOSIS — R10811 Right upper quadrant abdominal tenderness: Secondary | ICD-10-CM | POA: Insufficient documentation

## 2019-07-11 DIAGNOSIS — R109 Unspecified abdominal pain: Secondary | ICD-10-CM

## 2019-07-11 DIAGNOSIS — K3580 Unspecified acute appendicitis: Secondary | ICD-10-CM | POA: Insufficient documentation

## 2019-07-11 DIAGNOSIS — Z5320 Procedure and treatment not carried out because of patient's decision for unspecified reasons: Secondary | ICD-10-CM | POA: Insufficient documentation

## 2019-07-11 DIAGNOSIS — R1031 Right lower quadrant pain: Secondary | ICD-10-CM | POA: Diagnosis present

## 2019-07-11 DIAGNOSIS — F17210 Nicotine dependence, cigarettes, uncomplicated: Secondary | ICD-10-CM | POA: Diagnosis not present

## 2019-07-11 LAB — COMPREHENSIVE METABOLIC PANEL
ALT: 9 U/L (ref 0–44)
AST: 16 U/L (ref 15–41)
Albumin: 3.2 g/dL — ABNORMAL LOW (ref 3.5–5.0)
Alkaline Phosphatase: 45 U/L (ref 38–126)
Anion gap: 10 (ref 5–15)
BUN: 6 mg/dL (ref 6–20)
CO2: 22 mmol/L (ref 22–32)
Calcium: 9.1 mg/dL (ref 8.9–10.3)
Chloride: 109 mmol/L (ref 98–111)
Creatinine, Ser: 0.71 mg/dL (ref 0.44–1.00)
GFR calc Af Amer: >60 mL/min (ref >60)
GFR calc non Af Amer: >60 mL/min (ref >60)
Glucose, Bld: 88 mg/dL (ref 70–99)
Potassium: 3.9 mmol/L (ref 3.5–5.1)
Sodium: 141 mmol/L (ref 135–145)
Total Bilirubin: 0.2 mg/dL — ABNORMAL LOW (ref 0.3–1.2)
Total Protein: 7.3 g/dL (ref 6.5–8.1)

## 2019-07-11 LAB — URINALYSIS, ROUTINE W REFLEX MICROSCOPIC
Bilirubin Urine: NEGATIVE
Glucose, UA: NEGATIVE mg/dL
Ketones, ur: 20 mg/dL — AB
Leukocytes,Ua: NEGATIVE
Nitrite: NEGATIVE
Protein, ur: 30 mg/dL — AB
Specific Gravity, Urine: 1.038 — ABNORMAL HIGH (ref 1.005–1.030)
pH: 5 (ref 5.0–8.0)

## 2019-07-11 LAB — CBC WITH DIFFERENTIAL/PLATELET
Abs Immature Granulocytes: 0.06 10*3/uL (ref 0.00–0.07)
Basophils Absolute: 0 10*3/uL (ref 0.0–0.1)
Basophils Relative: 0 %
Eosinophils Absolute: 0.1 10*3/uL (ref 0.0–0.5)
Eosinophils Relative: 0 %
HCT: 36.8 % (ref 36.0–46.0)
Hemoglobin: 12.1 g/dL (ref 12.0–15.0)
Immature Granulocytes: 1 %
Lymphocytes Relative: 14 %
Lymphs Abs: 1.8 10*3/uL (ref 0.7–4.0)
MCH: 32 pg (ref 26.0–34.0)
MCHC: 32.9 g/dL (ref 30.0–36.0)
MCV: 97.4 fL (ref 80.0–100.0)
Monocytes Absolute: 1.2 10*3/uL — ABNORMAL HIGH (ref 0.1–1.0)
Monocytes Relative: 9 %
Neutro Abs: 9.6 10*3/uL — ABNORMAL HIGH (ref 1.7–7.7)
Neutrophils Relative %: 76 %
Platelets: 409 10*3/uL — ABNORMAL HIGH (ref 150–400)
RBC: 3.78 MIL/uL — ABNORMAL LOW (ref 3.87–5.11)
RDW: 13.6 % (ref 11.5–15.5)
WBC: 12.7 10*3/uL — ABNORMAL HIGH (ref 4.0–10.5)
nRBC: 0 % (ref 0.0–0.2)

## 2019-07-11 LAB — LIPASE, BLOOD: Lipase: 15 U/L (ref 11–51)

## 2019-07-11 LAB — I-STAT BETA HCG BLOOD, ED (MC, WL, AP ONLY): I-stat hCG, quantitative: 9.8 m[IU]/mL — ABNORMAL HIGH (ref <5)

## 2019-07-11 MED ORDER — OXYCODONE-ACETAMINOPHEN 5-325 MG PO TABS
1.0000 | ORAL_TABLET | ORAL | Status: DC | PRN
  Administered 2019-07-11: 1 via ORAL
  Filled 2019-07-11: qty 1

## 2019-07-11 NOTE — ED Triage Notes (Signed)
Pt arrives POV for eval of RLQ abd pain. Pt reports she took medication for an abortion 5/22 and has been doing ok since then. Started w/ R sided abd pain 3 days PTA, states most severe today. Pt tearful in triage.

## 2019-07-12 ENCOUNTER — Emergency Department (HOSPITAL_COMMUNITY): Payer: Medicaid Other

## 2019-07-12 ENCOUNTER — Other Ambulatory Visit: Payer: Self-pay

## 2019-07-12 MED ORDER — METRONIDAZOLE 500 MG PO TABS: 500.0000 mg | ORAL_TABLET | Freq: Three times a day (TID) | ORAL | 0 refills | Status: AC

## 2019-07-12 MED ORDER — MORPHINE SULFATE (PF) 4 MG/ML IV SOLN
4.0000 mg | Freq: Once | INTRAVENOUS | Status: AC
  Administered 2019-07-12: 4 mg via INTRAVENOUS
  Filled 2019-07-12: qty 1

## 2019-07-12 MED ORDER — IOHEXOL 300 MG/ML  SOLN
100.0000 mL | Freq: Once | INTRAMUSCULAR | Status: AC | PRN
  Administered 2019-07-12: 100 mL via INTRAVENOUS

## 2019-07-12 MED ORDER — ONDANSETRON HCL 4 MG PO TABS: 4.0000 mg | ORAL_TABLET | Freq: Three times a day (TID) | ORAL | 0 refills | Status: DC | PRN

## 2019-07-12 MED ORDER — CIPROFLOXACIN HCL 500 MG PO TABS: 500.0000 mg | ORAL_TABLET | Freq: Two times a day (BID) | ORAL | 0 refills | Status: AC

## 2019-07-12 MED ORDER — CIPROFLOXACIN HCL 500 MG PO TABS
500.0000 mg | ORAL_TABLET | Freq: Once | ORAL | Status: AC
  Administered 2019-07-12: 500 mg via ORAL
  Filled 2019-07-12: qty 1

## 2019-07-12 MED ORDER — HYDROCODONE-ACETAMINOPHEN 5-325 MG PO TABS: 1.0000 | ORAL_TABLET | Freq: Four times a day (QID) | ORAL | 0 refills | Status: DC | PRN

## 2019-07-12 MED ORDER — METRONIDAZOLE 500 MG PO TABS
500.0000 mg | ORAL_TABLET | Freq: Once | ORAL | Status: AC
  Administered 2019-07-12: 500 mg via ORAL
  Filled 2019-07-12: qty 1

## 2019-07-12 NOTE — Discharge Instructions (Addendum)
You have decided to leave AGAINST MEDICAL ADVICE.  You could have worsening if your condition ultimately ending in a severe injury or death. Take antibiotics as prescribed.  Take the entire course. Use Zofran as needed for nausea or vomiting. Use Tylenol or ibuprofen as needed for mild to moderate pain.  Use Norco as needed for severe breakthrough pain.  Have caution, this make you tired or groggy.  Do not drive or operate heavy machinery while taking this medicine. If you do not want to come back to the ER, I recommend you follow-up with the surgery group listed below.  However I do recommend that you return to the ER for further management of your appendicitis.

## 2019-07-12 NOTE — ED Provider Notes (Signed)
[facility] EMERGENCY DEPARTMENT Provider Note   CSN: [id number] Arrival date & time: 07/11/19  1713     History Chief Complaint  Patient presents with  . Abdominal Pain    Destiny Wu is a 24 y.o. female presenting for evaluation of abdominal pain.  Patient states of the past 2 days, she has had persistent right-sided abdominal pain.  Pain is constant, worse with movement and inspiration.  Nothing makes it better.  She has been taking BCs, ibuprofen, and Tylenol without improvement of symptoms.  It does not radiate anywhere.  She reports associated chills, no fevers.  She reports decreased p.o. intake, but no nausea or vomiting.  She denies urinary symptoms or abnormal bowel movements.  Patient had a medical abortion on 06/14/19, but had no complications following this.  She has continued to have intermittent vaginal bleeding.  She has not followed up with her OB/GYN since.  She has no other medical problems, takes medications daily.  No previous abdominal surgeries.  She denies vaginal discharge.  She has not been sexually active since taking the methotrexate.  She has been tested for STDs recently, all were negative.  HPI     Past Medical History:  Diagnosis Date  . UTI (urinary tract infection)     Patient Active Problem List   Diagnosis Date Noted  . 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 Eczema 08/16/2016  . Allergic rhinitis 08/16/2016  . Group B streptococcal bacteriuria 07/24/2016  . Supervision of high-risk pregnancy 07/19/2016  . History of stillbirth in currently pregnant patient 07/19/2016    Past Surgical History:  Procedure Laterality Date  . NO PAST SURGERIES       OB History    Gravida  5   Para  2   Term  1   Preterm  1   AB  1   Living  1     SAB  1   TAB      Ectopic      Multiple  0   Live Births  1           Family History  Problem Relation Age of Onset  . Diabetes  Maternal Grandmother   . Asthma Maternal Grandmother   . Hypertension Maternal Grandmother   . Asthma Mother   . Asthma Maternal Grandfather   . Diabetes Maternal Grandfather   . Hypertension Maternal Grandfather     Social History   Tobacco Use  . Smoking status: Current Every Day Smoker    Packs/day: 0.50    Years: 1.00    Pack years: 0.50    Types: Cigarettes    Last attempt to quit: 01/30/2016    Years since quitting: 3.4  . Smokeless tobacco: Never Used  Vaping Use  . Vaping Use: Never used  Substance Use Topics  . Alcohol use: No  . Drug use: No    Home Medications Prior to Admission medications   Medication Sig Start Date End Date Taking? Authorizing Provider  ibuprofen (ADVIL) 200 MG tablet Take 400 mg by mouth every 8 (eight) hours as needed for headache or moderate pain.   Yes [provider]  triamcinolone (KENALOG) 0.025 % cream Apply 1 application topically 2 (two) times daily.   Yes [provider]  ciprofloxacin (CIPRO) 500 MG tablet Take 1 tablet (500 mg total) by mouth 2 (two) times daily for 7 days. 07/12/19 07/19/19  Alexia Dinger, PA-C  HYDROcodone-acetaminophen (NORCO/VICODIN) 5-325 MG tablet  Take 1 tablet by mouth every 6 (six) hours as needed. 07/12/19   Jamacia Jester, PA-C  metroNIDAZOLE (FLAGYL) 500 MG tablet Take 1 tablet (500 mg total) by mouth 3 (three) times daily for 7 days. 07/12/19 07/19/19  Jamile Rekowski, PA-C  ondansetron (ZOFRAN) 4 MG tablet Take 1 tablet (4 mg total) by mouth every 8 (eight) hours as needed for nausea or vomiting. 07/12/19   Deaundra Kutzer, PA-C    Allergies    Latex  Review of Systems   Review of Systems  Constitutional: Positive for chills.  Gastrointestinal: Positive for abdominal pain.  All other systems reviewed and are negative.   Physical Exam Updated Vital Signs BP 110/73 (BP Location: Right Arm)   Pulse 86   Temp 99.7 F (37.6 C) (Oral)   Resp 17   Ht 5\' 6"  (1.676 m)   Wt  87.5 kg   SpO2 100%   BMI 31.15 kg/m   Physical Exam Vitals and nursing note reviewed.  Constitutional:      General: She is not in acute distress.    Appearance: She is well-developed.     Comments: Appears uncomfortable, otherwise nontoxic  HENT:     Head: Normocephalic and atraumatic.  Eyes:     Conjunctiva/sclera: Conjunctivae normal.     Pupils: Pupils are equal, round, and reactive to light.  Cardiovascular:     Rate and Rhythm: Normal rate and regular rhythm.     Pulses: Normal pulses.  Pulmonary:     Effort: Pulmonary effort is normal. No respiratory distress.     Breath sounds: Normal breath sounds. No wheezing.  Abdominal:     General: There is no distension.     Palpations: Abdomen is soft. There is no mass.     Tenderness: There is abdominal tenderness in the right upper quadrant and right lower quadrant. There is no right CVA tenderness, left CVA tenderness, guarding or rebound.     Comments: Tenderness palpation of right side of abdomen, mostly in the right upper quadrant.  No rigidity, guarding, distention.  Negative rebound.  No peritonitis.  Musculoskeletal:        General: Normal range of motion.     Cervical back: Normal range of motion and neck supple.  Skin:    General: Skin is warm and dry.     Capillary Refill: Capillary refill takes less than 2 seconds.  Neurological:     Mental Status: She is alert and oriented to person, place, and time.     ED Results / Procedures / Treatments   Labs (all labs ordered are listed, but only abnormal results are displayed) Labs Reviewed  COMPREHENSIVE METABOLIC PANEL - Abnormal; Notable for the following components:      Result Value   Albumin 3.2 (*)    Total Bilirubin 0.2 (*)    All other components within normal limits  URINALYSIS, ROUTINE W REFLEX MICROSCOPIC - Abnormal; Notable for the following components:   Specific Gravity, Urine 1.038 (*)    Hgb urine dipstick MODERATE (*)    Ketones, ur 20 (*)     Protein, ur 30 (*)    Bacteria, UA RARE (*)    All other components within normal limits  CBC WITH DIFFERENTIAL/PLATELET - Abnormal; Notable for the following components:   WBC 12.7 (*)    RBC 3.78 (*)    Platelets 409 (*)    Neutro Abs 9.6 (*)    Monocytes Absolute 1.2 (*)    All other  components within normal limits  I-STAT BETA HCG BLOOD, ED ([facility], [facility], [facility] ONLY) - Abnormal; Notable for the following components:   I-stat hCG, quantitative 9.8 (*)    All other components within normal limits  LIPASE, BLOOD    EKG None  Radiology CT ABDOMEN PELVIS W CONTRAST  Result Date: 07/12/2019 CLINICAL DATA:  Right lower quadrant abdominal pain EXAM: CT ABDOMEN AND PELVIS WITH CONTRAST TECHNIQUE: Multidetector CT imaging of the abdomen and pelvis was performed using the standard protocol following bolus administration of intravenous contrast. CONTRAST:  121mL OMNIPAQUE IOHEXOL 300 MG/ML  SOLN COMPARISON:  None. FINDINGS: Lower chest: The visualized heart size within normal limits. No pericardial fluid/thickening. No hiatal hernia. Hazy bibasilar atelectasis is noted. Hepatobiliary: The liver is normal in density without focal abnormality.The main portal vein is patent. No evidence of calcified gallstones, gallbladder wall thickening or biliary dilatation. Pancreas: Unremarkable. No pancreatic ductal dilatation or surrounding inflammatory changes. Spleen: Normal in size without focal abnormality. Adrenals/Urinary Tract: Both adrenal glands appear normal. The kidneys and collecting system appear normal without evidence of urinary tract calculus or hydronephrosis. Bladder is unremarkable. Stomach/Bowel: The stomach, small bowel, and colon are normal in appearance. No inflammatory changes, wall thickening, or obstructive findings.The appendix appears to be mildly prominent measuring up to 9 mm on the coronal images. There is question of minimal fat stranding changes seen at the appendiceal tip. No loculated  fluid collections or free air. Vascular/Lymphatic: There are no enlarged mesenteric, retroperitoneal, or pelvic lymph nodes. No significant vascular findings are present. Reproductive: A small amount of fluid seen within the endometrial canal. Trace free fluid seen within the cul-de-sac. Other: No evidence of abdominal wall mass or hernia. Musculoskeletal: No acute or significant osseous findings. IMPRESSION: Mildly prominent appendix with question of minimal surrounding fat stranding changes. This could be due to acute mild appendicitis. No periappendiceal free fluid or free air. Electronically Signed   By: Prudencio Pair M.D.   On: 07/12/2019 03:45   US Abdomen Limited RUQ  Result Date: 07/12/2019 CLINICAL DATA:  Right-sided abdominal pain EXAM: ULTRASOUND ABDOMEN LIMITED RIGHT UPPER QUADRANT COMPARISON:  None. FINDINGS: Gallbladder: No gallstones or wall thickening visualized. No sonographic Murphy sign noted by sonographer. However there is mild tenderness over the right upper quadrant. Common bile duct: Diameter: 5 mm Liver: No focal lesion identified. Within normal limits in parenchymal echogenicity. Portal vein is patent on color Doppler imaging with normal direction of blood flow towards the liver. Other: None. IMPRESSION: Normal appearing gallbladder and liver. Electronically Signed   By: Prudencio Pair M.D.   On: 07/12/2019 01:17    Procedures Procedures (including critical care time)  Medications Ordered in ED Medications  oxyCODONE-acetaminophen (PERCOCET/ROXICET) 5-325 MG per tablet 1 tablet (1 tablet Oral Given 07/11/19 1741)  ciprofloxacin (CIPRO) tablet 500 mg (has no administration in time range)  metroNIDAZOLE (FLAGYL) tablet 500 mg (has no administration in time range)  morphine 4 MG/ML injection 4 mg (4 mg Intravenous Given 07/12/19 0123)  iohexol (OMNIPAQUE) 300 MG/ML solution 100 mL (100 mLs Intravenous Contrast Given 07/12/19 0323)    ED Course  I have reviewed the triage vital  signs and the nursing notes.  Pertinent labs & imaging results that were available during my care of the patient were reviewed by me and considered in my medical decision making (see chart for details).    MDM Rules/Calculators/A&P  Patient presenting for evaluation of right-sided abdominal pain.  On exam, patient appears nontoxic.  She does appear uncomfortable, and is tender mostly in the right upper abdomen although also a little bit in the right lower abdomen.  Labs obtained from triage interpreted by me, shows leukocytosis of 12.7.  I-STAT hCG mildly elevated at 9.8, this is likely residual from previous pregnancy.  Urine is without infection, does show blood but she is also having vaginal bleeding.  Low suspicion for kidney stone as patient is without CVA tenderness, and has all anterior abdominal pain.  No abnormality in LFTs or bili, however higher suspicion for gallstone as pain is mostly right upper quadrant.  Lower suspicion for appendicitis or ovarian cyst.  Will obtain ultrasound for further evaluation.  Ultrasound negative for abnormality of the liver or gallbladder.  As patient was very uncomfortable appearing and with significant tenderness on exam, will obtain CT.  CT shows periappendiceal swelling/fluid.  Concerning for early appendectomy.  I discussed findings with patient.  Discussed recommendation for surgery.  Patient declined.  She states she needs to go home to take care of her son.  I discussed risks of leaving including perforation/rupture of the appendix and possible death.  Patient states she understands, but still like to leave.  Will have patient sign out AMA.  She appears to be competent to make her own medical decisions.  Encourage patient to return to the ER for further management.  Information for general surgery given.  Patient states she understands and agrees to plan.   Final Clinical Impression(s) / ED Diagnoses Final diagnoses:  Right  sided abdominal pain  Acute appendicitis, unspecified acute appendicitis type    Rx / DC Orders ED Discharge Orders         Ordered    ciprofloxacin (CIPRO) 500 MG tablet  2 times daily     Discontinue  Reprint     07/12/19 0429    metroNIDAZOLE (FLAGYL) 500 MG tablet  3 times daily     Discontinue  Reprint     07/12/19 0429    HYDROcodone-acetaminophen (NORCO/VICODIN) 5-325 MG tablet  Every 6 hours PRN     Discontinue  Reprint     07/12/19 0429    ondansetron (ZOFRAN) 4 MG tablet  Every 8 hours PRN     Discontinue  Reprint     07/12/19 0429           Johntae Broxterman, PA-C 07/12/19 0431    Zadie Rhine, MD 07/12/19 [phone]

## 2019-07-12 NOTE — ED Notes (Signed)
[place] to [place].

## 2019-08-06 ENCOUNTER — Ambulatory Visit (HOSPITAL_COMMUNITY)
Admission: EM | Admit: 2019-08-06 | Discharge: 2019-08-06 | Disposition: A | Discharge status: Home / Self Care | Payer: Medicaid Other | Attending: Emergency Medicine | Admitting: Emergency Medicine

## 2019-08-06 ENCOUNTER — Encounter (HOSPITAL_COMMUNITY): Payer: Self-pay

## 2019-08-06 DIAGNOSIS — R109 Unspecified abdominal pain: Secondary | ICD-10-CM

## 2019-08-06 DIAGNOSIS — Z3202 Encounter for pregnancy test, result negative: Secondary | ICD-10-CM | POA: Diagnosis not present

## 2019-08-06 LAB — POC URINE PREG, ED: Preg Test, Ur: NEGATIVE

## 2019-08-06 NOTE — ED Provider Notes (Signed)
[facility]    CSN: [id number] Arrival date & time: 08/06/19  1909      History   Chief Complaint Chief Complaint  Patient presents with  . Abdominal Cramping    HPI Destiny Wu is a 24 y.o. female otherwise healthy presents to urgent care with 2-day history of lower abdominal cramping. Patient states she also started her menstrual cycle approximately 3 days ago though does not typically experience this much cramping during her cycle. Patient denies any recent fever or chills, normal BM this morning with no diarrhea or constipation, denies any vaginal discharge prior to starting menstrual cycle, no dysuria. Patient does state she took a pill to induce spontaneous abortion in early pregnancy last month.  Past Medical History:  Diagnosis Date  . UTI (urinary tract infection)     Patient Active Problem List   Diagnosis Date Noted  . 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 Eczema 08/16/2016  . Allergic rhinitis 08/16/2016  . Group B streptococcal bacteriuria 07/24/2016  . Supervision of high-risk pregnancy 07/19/2016  . History of stillbirth in currently pregnant patient 07/19/2016    Past Surgical History:  Procedure Laterality Date  . NO PAST SURGERIES      OB History    Gravida  5   Para  2   Term  1   Preterm  1   AB  1   Living  1     SAB  1   TAB      Ectopic      Multiple  0   Live Births  1            Home Medications    Prior to Admission medications   Medication Sig Start Date End Date Taking? Authorizing Provider  HYDROcodone-acetaminophen (NORCO/VICODIN) 5-325 MG tablet Take 1 tablet by mouth every 6 (six) hours as needed. 07/12/19   Caccavale, Sophia, PA-C  ibuprofen (ADVIL) 200 MG tablet Take 400 mg by mouth every 8 (eight) hours as needed for headache or moderate pain.    [provider]  ondansetron (ZOFRAN) 4 MG tablet Take 1 tablet (4 mg total) by mouth every 8 (eight) hours as  needed for nausea or vomiting. 07/12/19   Caccavale, Sophia, PA-C  triamcinolone (KENALOG) 0.025 % cream Apply 1 application topically 2 (two) times daily.    [provider]    Family History Family History  Problem Relation Age of Onset  . Diabetes Maternal Grandmother   . Asthma Maternal Grandmother   . Hypertension Maternal Grandmother   . Asthma Mother   . Asthma Maternal Grandfather   . Diabetes Maternal Grandfather   . Hypertension Maternal Grandfather     Social History Social History   Tobacco Use  . Smoking status: Current Every Day Smoker    Packs/day: 0.50    Years: 1.00    Pack years: 0.50    Types: Cigarettes    Last attempt to quit: 01/30/2016    Years since quitting: 3.5  . Smokeless tobacco: Never Used  Vaping Use  . Vaping Use: Never used  Substance Use Topics  . Alcohol use: No  . Drug use: No     Allergies   Latex   Review of Systems As stated in HPI otherwise negative   Physical Exam Triage Vital Signs ED Triage Vitals  Enc Vitals Group     BP 08/06/19 2008 125/74     Pulse Rate 08/06/19 2008 79  Resp 08/06/19 2008 16     Temp 08/06/19 2008 98.4 F (36.9 C)     Temp Source 08/06/19 2008 Oral     SpO2 08/06/19 2008 96 %     Weight --      Height --      Head Circumference --      Peak Flow --      Pain Score 08/06/19 2012 8     Pain Loc --      Pain Edu? --      Excl. in GC? --    No data found.  Updated Vital Signs BP 125/74 (BP Location: Right Arm)   Pulse 79   Temp 98.4 F (36.9 C) (Oral)   Resp 16   LMP  (Within Days) Comment: 3-4 days  SpO2 96%   Breastfeeding No      Physical Exam Constitutional:      General: She is not in acute distress.    Appearance: Normal appearance. She is not ill-appearing.  HENT:     Mouth/Throat:     Mouth: Mucous membranes are moist.  Abdominal:     General: Bowel sounds are normal. There is no distension.     Palpations: Abdomen is soft.     Tenderness: There is no  guarding or rebound.  Skin:    General: Skin is warm and dry.  Neurological:     Mental Status: She is alert.  Psychiatric:        Mood and Affect: Mood normal.        Behavior: Behavior normal.      UC Treatments / Results  Labs (all labs ordered are listed, but only abnormal results are displayed) Labs Reviewed  URINE CULTURE  POC URINE PREG, ED  CERVICOVAGINAL ANCILLARY ONLY    EKG   Radiology No results found.  Procedures Procedures (including critical care time)  Medications Ordered in UC Medications - No data to display  Initial Impression / Assessment and Plan / UC Course  I have reviewed the triage vital signs and the nursing notes.  Pertinent labs & imaging results that were available during my care of the patient were reviewed by me and considered in my medical decision making (see chart for details).  Clinical Course as of Aug 06 2139  Wed Aug 06, 2019  2121 POCT Urinalysis, Dipstick [LS]    Clinical Course User Index [LS] Rolla Etienne, NP   Abdominal cramping -Unclear etiology. Suspect related to current menstrual cycle. Exam unremarkable, no evidence of UTI. -We will follow up with STI cultures -Patient instructed follow-up if symptoms persist when menstrual cycle complete or if any increased abdominal pain or high fever.  Final Clinical Impressions(s) / UC Diagnoses   Final diagnoses:  Abdominal cramping     Discharge Instructions     We will follow up with you if any of your tests come back abnormal. Your urine test does not show any infection. Please return for any high fever or increase in abdominal pain. You can also take Motrin 600mg  every 8 hours as needed for cramping.     ED Prescriptions    None     PDMP not reviewed this encounter.   , NP 08/06/19 [phone]

## 2019-08-06 NOTE — ED Triage Notes (Signed)
Pt presents with lower abdominal cramping and discomfort when urinating x 2 days. States her menstrual period started around the same time as the abdominal cramps. States having discomfort when urinating.

## 2019-08-06 NOTE — Discharge Instructions (Addendum)
We will follow up with you if any of your tests come back abnormal. Your urine test does not show any infection. Please return for any high fever or increase in abdominal pain. You can also take Motrin 600mg  every 8 hours as needed for cramping.

## 2019-08-07 LAB — POCT URINALYSIS DIP (DEVICE)
Glucose, UA: NEGATIVE mg/dL
Leukocytes,Ua: NEGATIVE
Nitrite: NEGATIVE
Protein, ur: 30 mg/dL — AB
Specific Gravity, Urine: >1.03 — ABNORMAL HIGH (ref 1.005–1.030)
Urobilinogen, UA: 1 mg/dL (ref 0.0–1.0)
pH: 6 (ref 5.0–8.0)

## 2019-08-08 ENCOUNTER — Other Ambulatory Visit: Payer: Self-pay

## 2019-08-08 ENCOUNTER — Ambulatory Visit (HOSPITAL_COMMUNITY)
Admission: EM | Admit: 2019-08-08 | Discharge: 2019-08-08 | Disposition: A | Discharge status: Home / Self Care | Payer: Medicaid Other | Attending: Emergency Medicine | Admitting: Emergency Medicine

## 2019-08-08 ENCOUNTER — Telehealth: Payer: Self-pay

## 2019-08-08 DIAGNOSIS — N760 Acute vaginitis: Secondary | ICD-10-CM

## 2019-08-08 DIAGNOSIS — B3731 Acute candidiasis of vulva and vagina: Secondary | ICD-10-CM

## 2019-08-08 DIAGNOSIS — B9689 Other specified bacterial agents as the cause of diseases classified elsewhere: Secondary | ICD-10-CM

## 2019-08-08 DIAGNOSIS — A549 Gonococcal infection, unspecified: Secondary | ICD-10-CM | POA: Diagnosis not present

## 2019-08-08 DIAGNOSIS — A749 Chlamydial infection, unspecified: Secondary | ICD-10-CM

## 2019-08-08 LAB — URINE CULTURE: Culture: <10000 — AB

## 2019-08-08 LAB — CERVICOVAGINAL ANCILLARY ONLY
Bacterial Vaginitis (gardnerella): POSITIVE — AB
Candida Glabrata: NEGATIVE
Candida Vaginitis: POSITIVE — AB
Chlamydia: POSITIVE — AB
Comment: NEGATIVE
Comment: NEGATIVE
Comment: NEGATIVE
Comment: NEGATIVE
Comment: NEGATIVE
Comment: NORMAL
Neisseria Gonorrhea: POSITIVE — AB
Trichomonas: NEGATIVE

## 2019-08-08 MED ORDER — FLUCONAZOLE 150 MG PO TABS: ORAL_TABLET | ORAL | 0 refills | Status: DC

## 2019-08-08 MED ORDER — CEFTRIAXONE SODIUM 500 MG IJ SOLR
INTRAMUSCULAR | Status: AC
  Filled 2019-08-08: qty 500

## 2019-08-08 MED ORDER — CEFTRIAXONE SODIUM 500 MG IJ SOLR
500.0000 mg | Freq: Once | INTRAMUSCULAR | Status: AC
  Administered 2019-08-08: 500 mg via INTRAMUSCULAR

## 2019-08-08 MED ORDER — METRONIDAZOLE 500 MG PO TABS: 500.0000 mg | ORAL_TABLET | Freq: Two times a day (BID) | ORAL | 0 refills | Status: DC

## 2019-08-08 MED ORDER — LIDOCAINE HCL (PF) 1 % IJ SOLN
INTRAMUSCULAR | Status: AC
  Filled 2019-08-08: qty 2

## 2019-08-08 MED ORDER — AZITHROMYCIN 500 MG PO TABS: ORAL_TABLET | ORAL | 0 refills | Status: DC

## 2019-08-08 NOTE — Telephone Encounter (Signed)
Attempted to reach patient. No answer at this time. Unable to leave voicemail.    Test for candida (yeast) was positive.  Prescription for fluconazole 150mg  po now, repeat dose in 3d if needed, #2 no refills, sent to the pharmacy of record.  Recheck or followup with PCP for further evaluation if symptoms are not improving.    Bacterial vaginosis is positive. Pt needs treatment. Flagyl 500 mg BID x 7 days #14 no refills sent to patients pharmacy of choice.     Gonorrhea is positive.  Patient should return as soon as possible to the urgent care for treatment with 500mg  IM rocephin. Patient will not need to see a provider unless there are new symptoms she would like evaluated.   Chlamydia is positive.  Prescription for Azithromycin sent to pharmacy of record.   Please refrain from sexual intercourse for now and for 7 days after treatment to give the medicine time to work.  Sexual partners need to be notified and tested/treated.  Condoms may reduce risk of reinfection. [facility] notified.

## 2019-08-10 IMAGING — US US MFM OB FOLLOW-UP
1 series · 13 of 28 positions shown · non-contrast
Comparison: none

[Series 1: us mfm ob follow-up · 59 acquisitions, 13 frames shown]
[im 3/59]
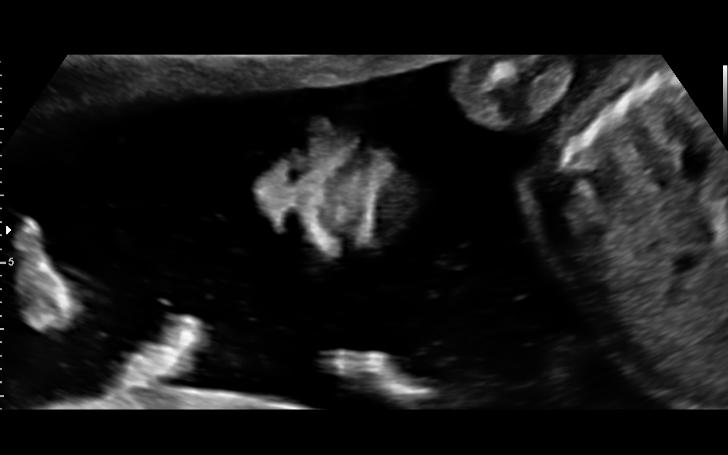
[im 7/59]
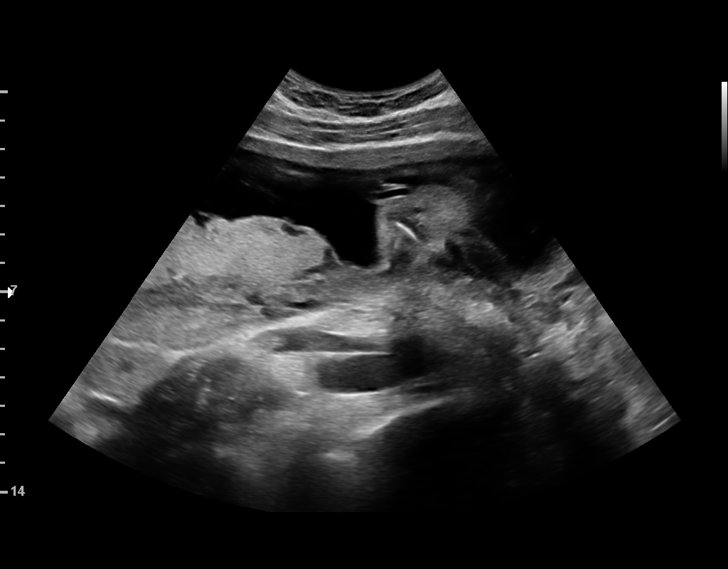
[im 11/59]
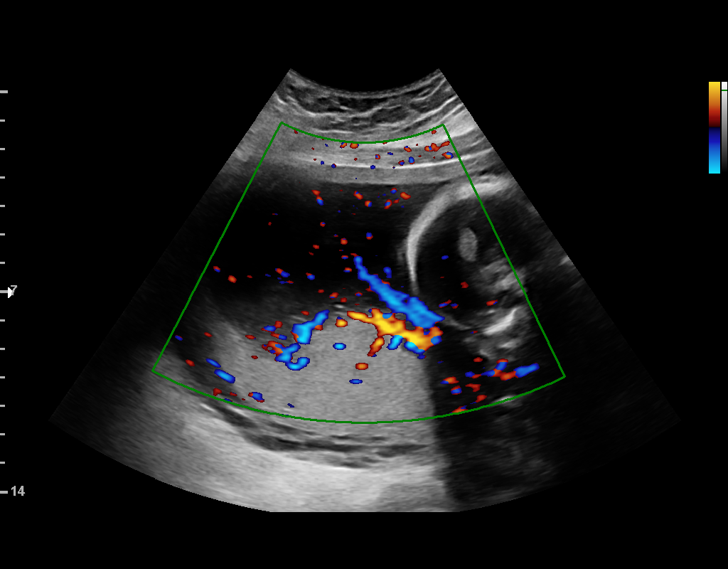
[im 16/59]
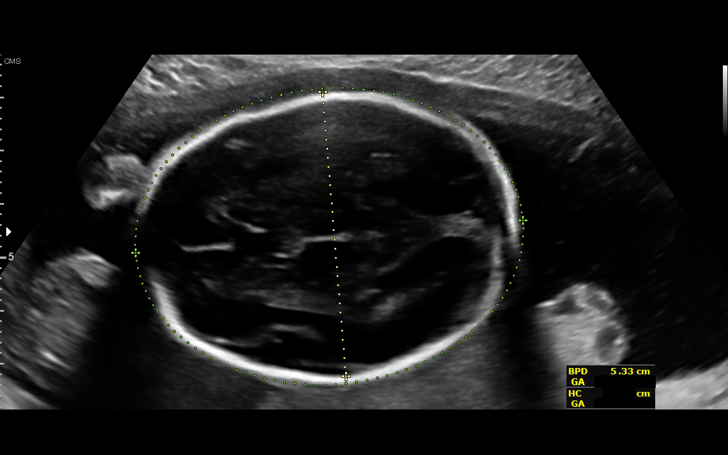
[im 20/59]
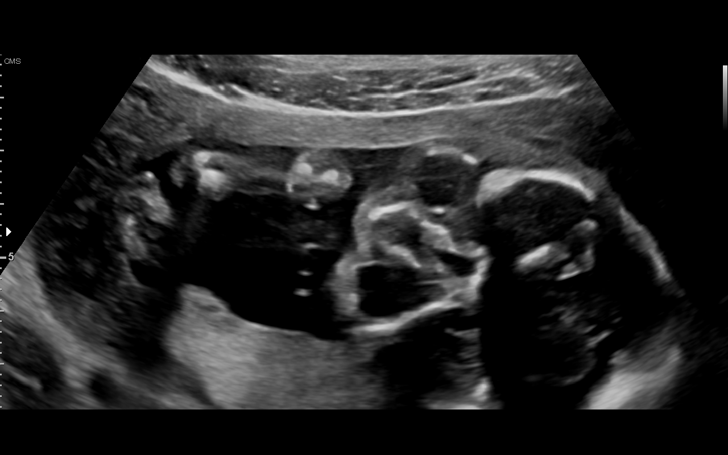
[im 24/59]
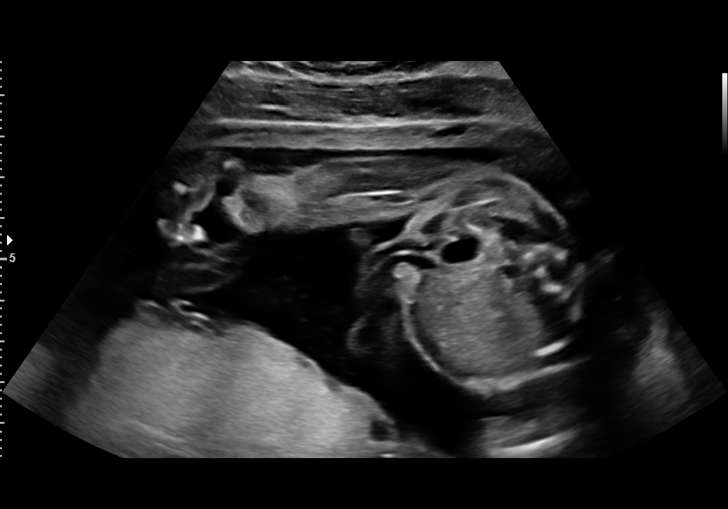
[im 31/59]
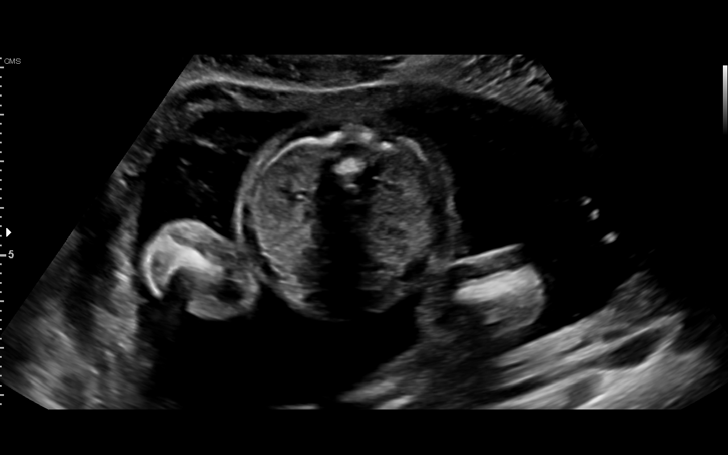
[im 35/59]
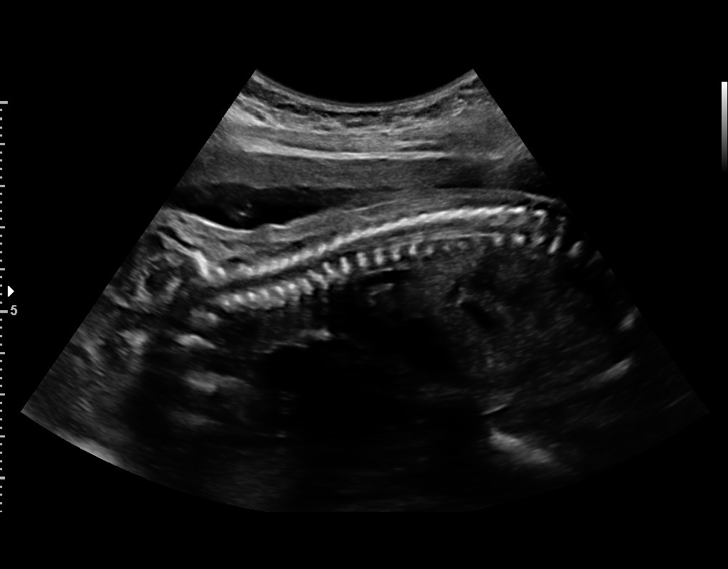
[im 39/59]
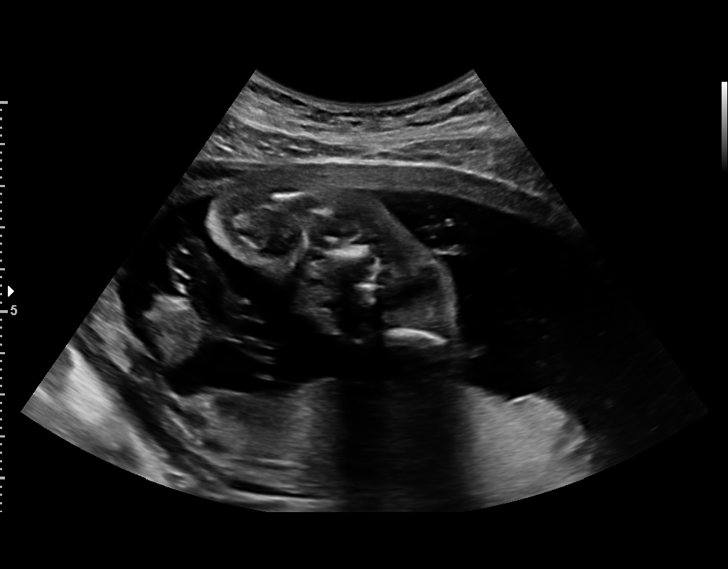
[im 43/59]
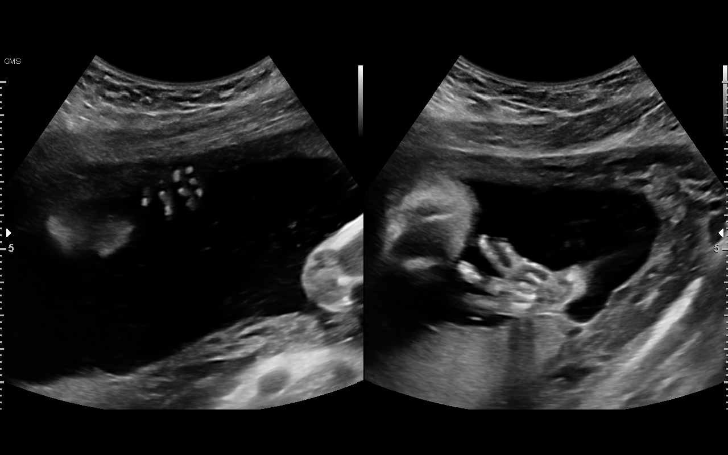
[im 48/59]
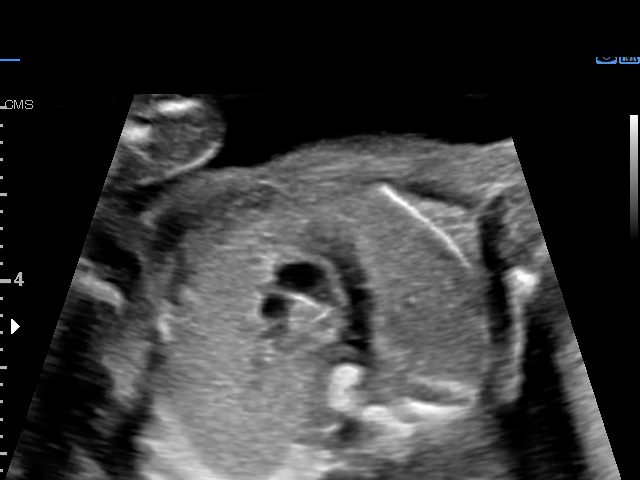
[im 52/59]
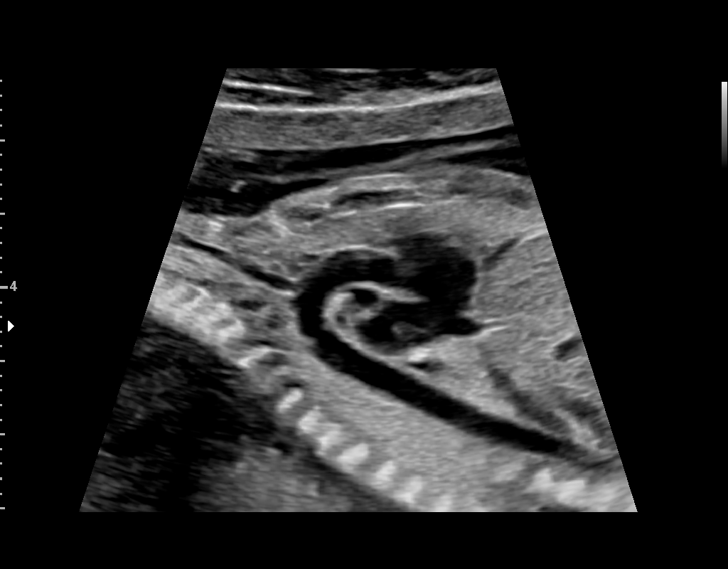
[im 56/59]
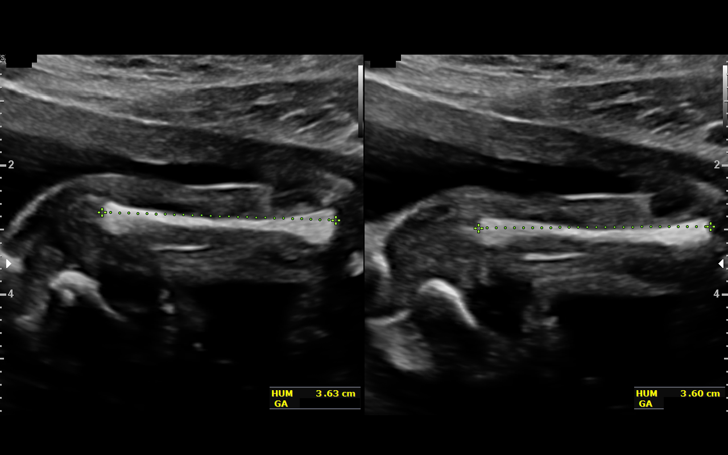

[13 of 28 positions shown; findings below may reference images not displayed]

[REDACTED]

1  SALVATORE              [id number]      [id number]     [id number]
Indications

23 weeks gestation of pregnancy
Poor obstetric history: Previous IUFD
(stillbirth) 34 weeks
Encounter for other antenatal screening
follow-up
OB History

Gravidity:    3          SAB:   1
Living:       0
Fetal Evaluation

Num Of Fetuses:     1
Fetal Heart         136
Rate(bpm):
Cardiac Activity:   Observed
Presentation:       Breech
Placenta:           Posterior, above cervical os
P. Cord Insertion:  Visualized

Amniotic Fluid
AFI FV:      Subjectively within normal limits

Largest Pocket(cm)
4.8
Biometry

BPD:      53.2  mm     G. Age:  22w 1d         13  %    CI:        71.22   %    70 - 86
FL/HC:      19.1   %    19.2 -
HC:      200.8  mm     G. Age:  22w 2d          9  %    HC/AC:      1.11        1.05 -
AC:      181.3  mm     G. Age:  23w 0d         37  %    FL/BPD:     72.0   %    71 - 87
FL:       38.3  mm     G. Age:  22w 2d         15  %    FL/AC:      21.1   %    20 - 24
HUM:      36.1  mm     G. Age:  22w 4d         30  %
CER:      22.6  mm     G. Age:  21w 2d         10  %
CM:        5.2  mm

Est. FW:     518  gm      1 lb 2 oz     41  %
Gestational Age

LMP:           23w 1d        Date:  05/16/16                 EDD:   02/20/17
U/S Today:     22w 3d                                        EDD:   02/25/17
Best:          23w 1d     Det. By:  LMP  (05/16/16)          EDD:   02/20/17
Anatomy

Cranium:               Appears normal         Aortic Arch:            Appears normal
Cavum:                 Appears normal         Ductal Arch:            Appears normal
Ventricles:            Appears normal         Diaphragm:              Appears normal
Choroid Plexus:        Appears normal         Stomach:                Appears normal, left
sided
Cerebellum:            Appears normal         Abdomen:                Appears normal
Posterior Fossa:       Appears normal         Abdominal Wall:         Appears nml (cord
insert, abd wall)
Nuchal Fold:           Previously seen        Cord Vessels:           Appears normal (3
vessel cord)
Face:                  Appears normal         Kidneys:                Appear normal
(orbits and profile)
Lips:                  Appears normal         Bladder:                Appears normal
Thoracic:              Appears normal         Spine:                  Previously seen
Heart:                 Appears normal         Upper Extremities:      Previously seen
(4CH, axis, and
situs)
RVOT:                  Appears normal         Lower Extremities:      Previously seen
LVOT:                  Appears normal

Other:  Nasal bone visualized. 5th digit visualized. Male gender previously
visualized. Heels previously visualized. Technically difficult due to
fetal position.
Cervix Uterus Adnexa

Cervix
Length:            3.4  cm.
Normal appearance by transabdominal scan.

Uterus
No abnormality visualized.

Left Ovary
Not visualized.

Right Ovary
Not visualized.

Cul De Sac:   No free fluid seen.

Adnexa:       No abnormality visualized.
Impression

SIUP at 23+1 weeks
Normal interval anatomy; anatomic survey complete
Normal amniotic fluid volume
Appropriate interval growth with EFW at the 41st %tile
Recommendations

Follow-up ultrasound for growth in 4 weeks (h/o IUFD)

## 2019-09-07 IMAGING — US US MFM OB FOLLOW-UP
2 series · 14 of 28 positions shown · non-contrast
Comparison: none

[Series 1: us mfm ob follow-up · 26 acquisitions, 11 frames shown (1 of 2)]
[im 2/26]
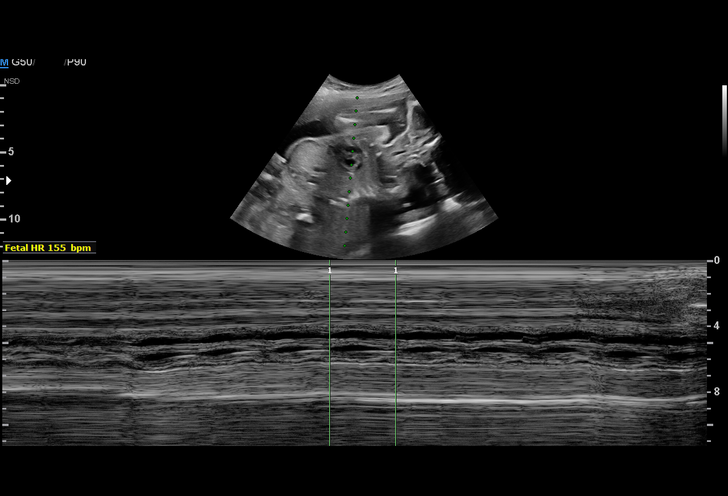
[im 4/26]
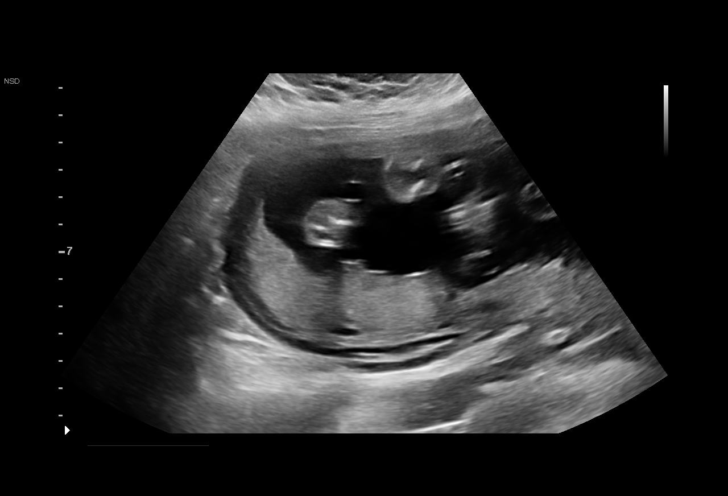
[im 6/26]
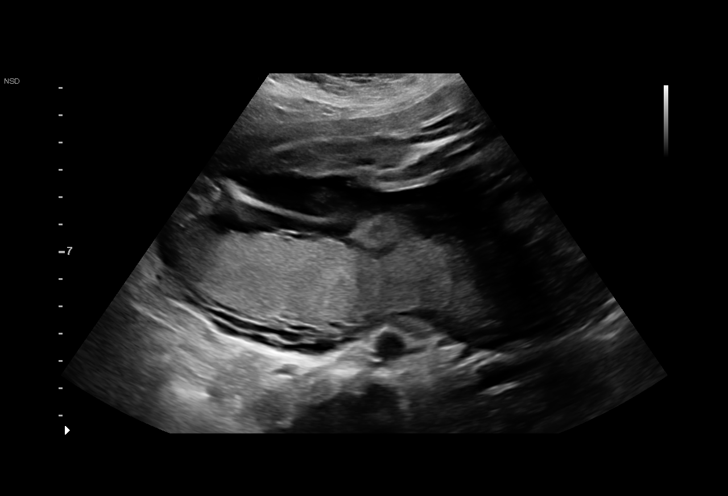
[im 8/26]
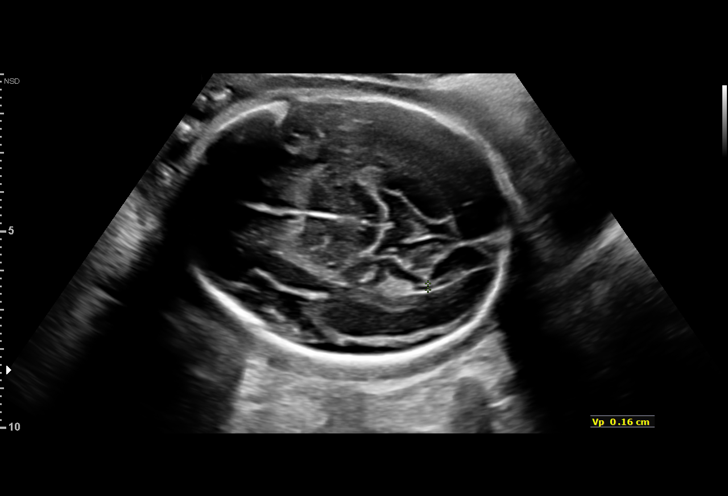
[im 11/26]
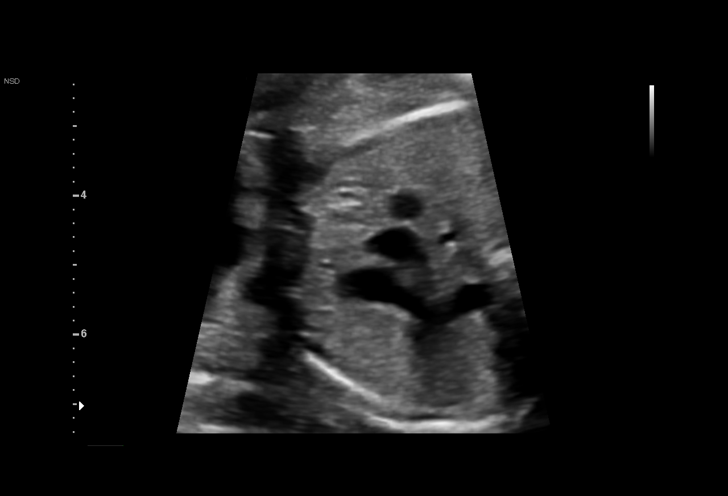
[im 13/26]
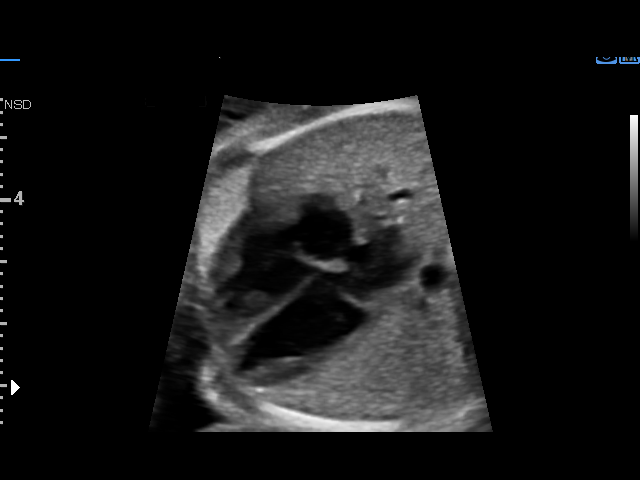
[im 15/26]
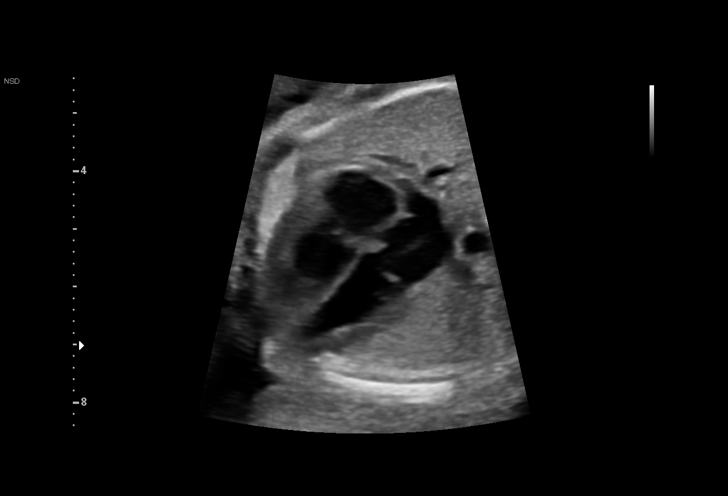
[im 18/26]
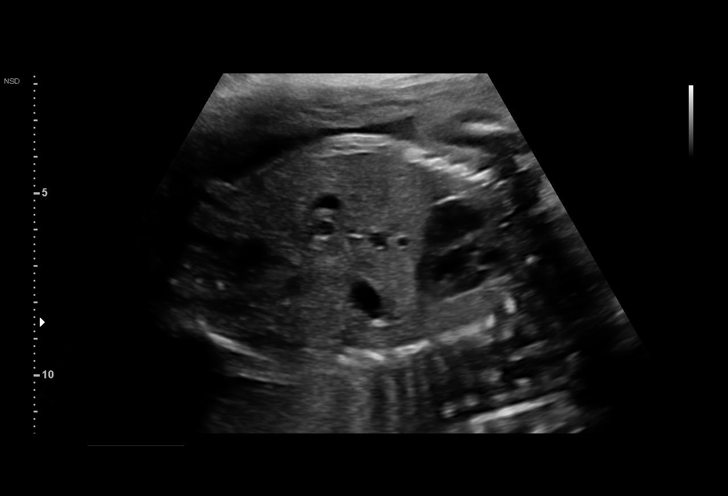
[im 20/26]
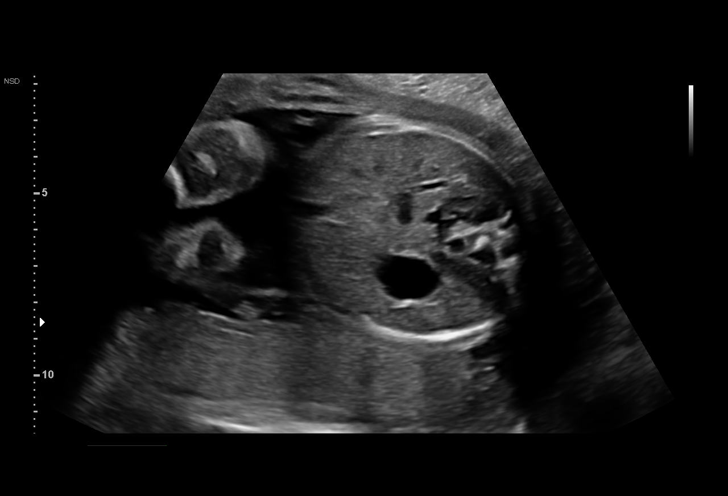
[im 22/26]
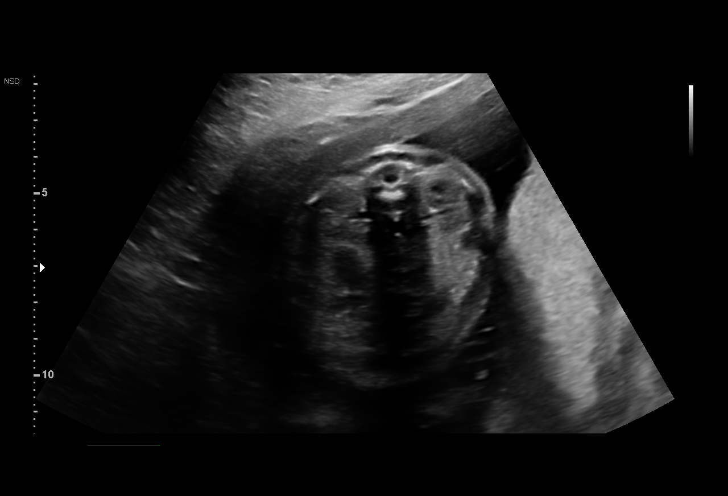
[im 24/26]
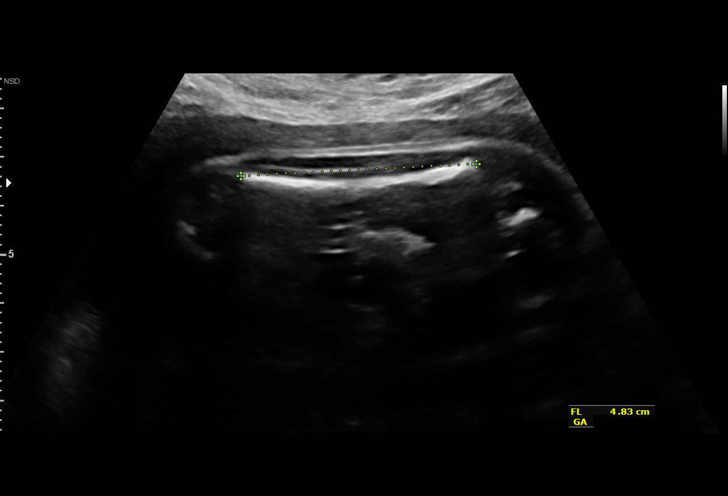

[Series 4: us mfm ob follow-up · 3 of 5 slices shown (2 of 2)]
[im 1/5]
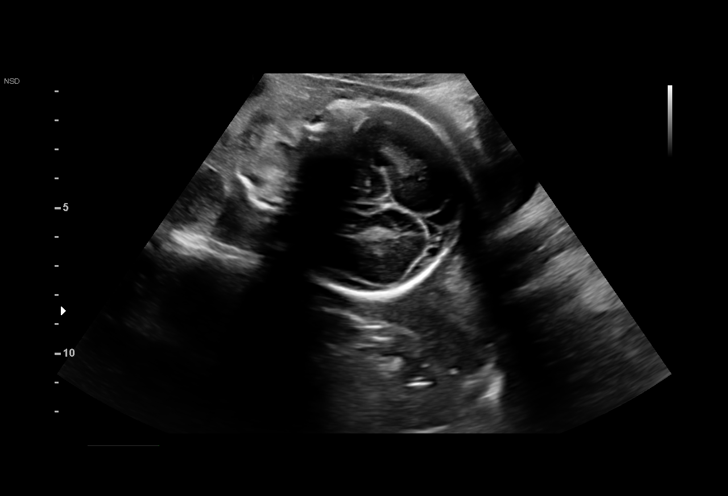
[im 3/5]
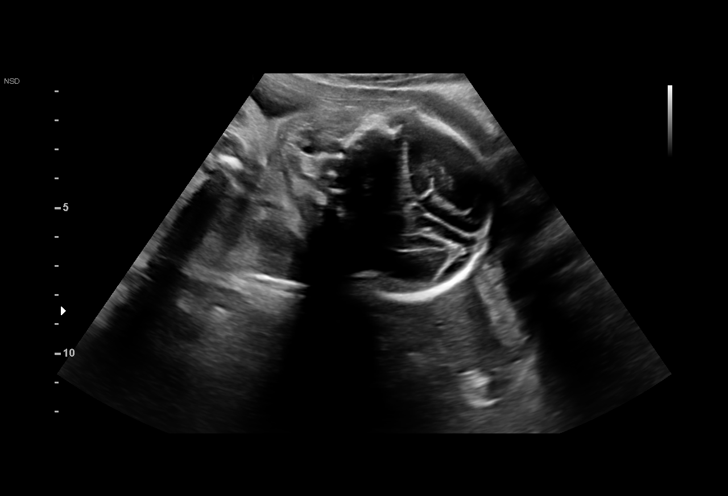
[im 5/5]
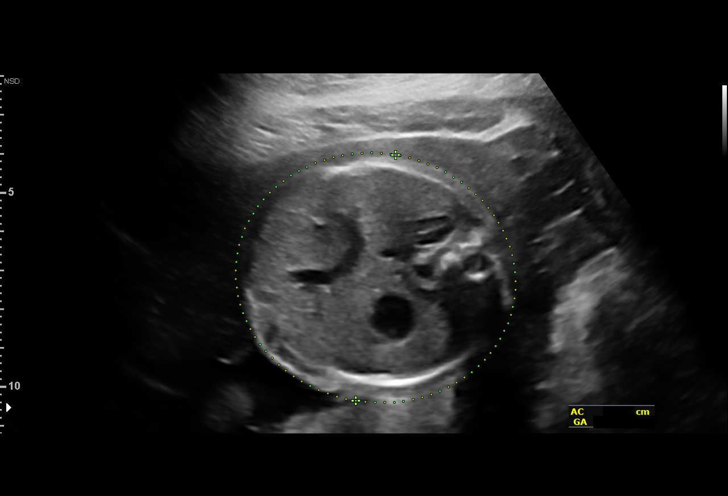

[14 of 28 positions shown; findings below may reference images not displayed]

[facility]

OB/Gyn Clinic
[REDACTED]

1  GUANAKO CREAZY BAUTISTA ALFARO            [id number]      [id number]     [id number]
Indications

27 weeks gestation of pregnancy
Poor obstetric history: Previous IUFD
(stillbirth) 34 weeks
Encounter for other antenatal screening
follow-up
OB History

Gravidity:    3          SAB:   1
Living:       0
Fetal Evaluation

Num Of Fetuses:     1
Fetal Heart         155
Rate(bpm):
Cardiac Activity:   Observed
Presentation:       Cephalic
Placenta:           Posterior, above cervical os
P. Cord Insertion:  Previously Visualized
Amniotic Fluid
AFI FV:      Subjectively within normal limits

Largest Pocket(cm)
5.5
Biometry

BPD:      67.3  mm     G. Age:  27w 1d         37  %    CI:        76.93   %    70 - 86
FL/HC:      20.0   %    18.6 -
HC:       243   mm     G. Age:  26w 3d          8  %    HC/AC:      1.13        1.05 -
AC:      214.6  mm     G. Age:  26w 0d         12  %    FL/BPD:     72.2   %    71 - 87
FL:       48.6  mm     G. Age:  26w 2d         15  %    FL/AC:      22.6   %    20 - 24

Est. FW:     904  gm           2 lb     32  %
Gestational Age

LMP:           27w 1d        Date:  05/16/16                 EDD:   02/20/17
U/S Today:     26w 3d                                        EDD:   02/25/17
Best:          27w 1d     Det. By:  LMP  (05/16/16)          EDD:   02/20/17
Anatomy

Cranium:               Appears normal         Aortic Arch:            Appears normal
Cavum:                 Appears normal         Ductal Arch:            Appears normal
Ventricles:            Appears normal         Diaphragm:              Appears normal
Choroid Plexus:        Appears normal         Stomach:                Appears normal, left
sided
Cerebellum:            Appears normal         Abdomen:                Appears normal
Posterior Fossa:       Appears normal         Abdominal Wall:         Appears nml (cord
insert, abd wall)
Nuchal Fold:           Previously seen        Cord Vessels:           Appears normal (3
vessel cord)
Face:                  Appears normal         Kidneys:                Appear normal
(orbits and profile)
Lips:                  Appears normal         Bladder:                Appears normal
Thoracic:              Appears normal         Spine:                  Previously seen
Heart:                 Appears normal         Upper Extremities:      Previously seen
(4CH, axis, and
situs)
RVOT:                  Appears normal         Lower Extremities:      Previously seen
LVOT:                  Appears normal

Other:  Nasal bone visualized. 5th digit visualized. Male gender previously
visualized. Heels previously visualized. Technically difficult due to
fetal position.
Cervix Uterus Adnexa

Cervix
Length:            3.1  cm.
Normal appearance by transabdominal scan.
Impression

Single living intrauterine pregnancy at 27w 1d.
Cephalic presentation.
Placenta Posterior, above cervical os.
Normal amniotic fluid volume.
Appropriate interval fetal growth.
Normal interval fetal anatomy.
Recommendations

Continue serial ultrasounds for fetal growth.
Recommend antenatal testing to begin at 30 weeks (history
of IUFD at 34 weeks).

## 2019-09-21 ENCOUNTER — Ambulatory Visit (HOSPITAL_COMMUNITY)
Admission: EM | Admit: 2019-09-21 | Discharge: 2019-09-21 | Disposition: A | Discharge status: Home / Self Care | Payer: Medicaid Other | Attending: Urgent Care | Admitting: Urgent Care

## 2019-09-21 ENCOUNTER — Other Ambulatory Visit: Payer: Self-pay

## 2019-09-21 ENCOUNTER — Encounter (HOSPITAL_COMMUNITY): Payer: Self-pay

## 2019-09-21 DIAGNOSIS — N898 Other specified noninflammatory disorders of vagina: Secondary | ICD-10-CM | POA: Diagnosis not present

## 2019-09-21 DIAGNOSIS — R05 Cough: Secondary | ICD-10-CM | POA: Diagnosis present

## 2019-09-21 DIAGNOSIS — Z20822 Contact with and (suspected) exposure to covid-19: Secondary | ICD-10-CM | POA: Diagnosis not present

## 2019-09-21 DIAGNOSIS — Z3202 Encounter for pregnancy test, result negative: Secondary | ICD-10-CM

## 2019-09-21 DIAGNOSIS — R197 Diarrhea, unspecified: Secondary | ICD-10-CM | POA: Insufficient documentation

## 2019-09-21 DIAGNOSIS — B349 Viral infection, unspecified: Secondary | ICD-10-CM | POA: Insufficient documentation

## 2019-09-21 DIAGNOSIS — Z79899 Other long term (current) drug therapy: Secondary | ICD-10-CM | POA: Insufficient documentation

## 2019-09-21 DIAGNOSIS — Z202 Contact with and (suspected) exposure to infections with a predominantly sexual mode of transmission: Secondary | ICD-10-CM | POA: Diagnosis not present

## 2019-09-21 DIAGNOSIS — F17210 Nicotine dependence, cigarettes, uncomplicated: Secondary | ICD-10-CM | POA: Insufficient documentation

## 2019-09-21 LAB — POCT URINALYSIS DIPSTICK, ED / UC
Bilirubin Urine: NEGATIVE
Glucose, UA: NEGATIVE mg/dL
Hgb urine dipstick: NEGATIVE
Leukocytes,Ua: NEGATIVE
Nitrite: NEGATIVE
Protein, ur: 30 mg/dL — AB
Specific Gravity, Urine: >=1.03 (ref 1.005–1.030)
Urobilinogen, UA: 0.2 mg/dL (ref 0.0–1.0)
pH: 6 (ref 5.0–8.0)

## 2019-09-21 LAB — POC URINE PREG, ED: Preg Test, Ur: NEGATIVE

## 2019-09-21 LAB — HIV ANTIBODY (ROUTINE TESTING W REFLEX): HIV Screen 4th Generation wRfx: NONREACTIVE

## 2019-09-21 MED ORDER — BENZONATATE 100 MG PO CAPS: 100.0000 mg | ORAL_CAPSULE | Freq: Three times a day (TID) | ORAL | 0 refills | Status: DC

## 2019-09-21 MED ORDER — FLUTICASONE PROPIONATE 50 MCG/ACT NA SUSP: 1.0000 | Freq: Every day | NASAL | 2 refills | Status: DC

## 2019-09-21 NOTE — ED Triage Notes (Signed)
Pt c/o diarrheax2 days and exposure to COVID + person. Pt denies any other sx. Pt c/o yellow vaginal dischargex2 days. Pt wants STI testing.

## 2019-09-21 NOTE — ED Provider Notes (Signed)
[facility]    CSN: [id number] Arrival date & time: 09/21/19  1708      History   Chief Complaint Chief Complaint  Patient presents with  . COVID test and STI testing    HPI Destiny Wu is a 24 y.o. female.   Patient presents for Covid testing and STI testing.  She reports she had a Covid exposure a few days ago and since then has had runny nose, slight cough and loose stools.  She reports 2-3 loose stools throughout the day.  No abdominal cramping or pain.  No nausea or vomiting.  Cough has been slight and dry.  She has had some nasal congestion.  Denies headache, fevers, chills.  No taste and smell changes.  No shortness of breath.  Otherwise feeling quite well.  She does endorse over the last 2 days developing yellow vaginal discharge.  No itching or pain.  Does endorse a little bit increased frequency and urgency, however states this is not very much difference from her usual.  No painful urination.  She would like STD testing.  She did not receive Covid vaccines.     Past Medical History:  Diagnosis Date  . UTI (urinary tract infection)     Patient Active Problem List   Diagnosis Date Noted  . 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 Eczema 08/16/2016  . Allergic rhinitis 08/16/2016  . Group B streptococcal bacteriuria 07/24/2016  . Supervision of high-risk pregnancy 07/19/2016  . History of stillbirth in currently pregnant patient 07/19/2016    Past Surgical History:  Procedure Laterality Date  . NO PAST SURGERIES      OB History    Gravida  5   Para  2   Term  1   Preterm  1   AB  1   Living  1     SAB  1   TAB      Ectopic      Multiple  0   Live Births  1            Home Medications    Prior to Admission medications   Medication Sig Start Date End Date Taking? Authorizing Provider  azithromycin (ZITHROMAX) 500 MG tablet Take two (2) tablets by mouth for a total of 1gm. 08/08/19   Lamptey,  Britta Mccreedy, MD  benzonatate (TESSALON) 100 MG capsule Take 1 capsule (100 mg total) by mouth every 8 (eight) hours. 09/21/19   Byrd Terrero, Veryl Speak, PA-C  fluconazole (DIFLUCAN) 150 MG tablet Take one (1) tablet.  If you are still having symptoms after three days, you can take the remaining tablet. 08/08/19   Merrilee Jansky, MD  fluticasone (FLONASE) 50 MCG/ACT nasal spray Place 1 spray into both nostrils daily. 09/21/19   Dalicia Kisner, Veryl Speak, PA-C  HYDROcodone-acetaminophen (NORCO/VICODIN) 5-325 MG tablet Take 1 tablet by mouth every 6 (six) hours as needed. 07/12/19   Caccavale, Sophia, PA-C  ibuprofen (ADVIL) 200 MG tablet Take 400 mg by mouth every 8 (eight) hours as needed for headache or moderate pain.    [provider]  metroNIDAZOLE (FLAGYL) 500 MG tablet Take 1 tablet (500 mg total) by mouth 2 (two) times daily. 08/08/19   Lamptey, Britta Mccreedy, MD  ondansetron (ZOFRAN) 4 MG tablet Take 1 tablet (4 mg total) by mouth every 8 (eight) hours as needed for nausea or vomiting. 07/12/19   Caccavale, Sophia, PA-C  triamcinolone (KENALOG) 0.025 % cream Apply 1 application topically 2 (  two) times daily.    [provider]    Family History Family History  Problem Relation Age of Onset  . Diabetes Maternal Grandmother   . Asthma Maternal Grandmother   . Hypertension Maternal Grandmother   . Asthma Mother   . Asthma Maternal Grandfather   . Diabetes Maternal Grandfather   . Hypertension Maternal Grandfather     Social History Social History   Tobacco Use  . Smoking status: Current Every Day Smoker    Packs/day: 0.50    Years: 1.00    Pack years: 0.50    Types: Cigarettes    Last attempt to quit: 01/30/2016    Years since quitting: 3.6  . Smokeless tobacco: Never Used  Vaping Use  . Vaping Use: Never used  Substance Use Topics  . Alcohol use: No  . Drug use: No     Allergies   Latex   Review of Systems Review of Systems   Physical Exam Triage Vital Signs ED Triage Vitals    Enc Vitals Group     BP 09/21/19 1742 (!) 94/54     Pulse Rate 09/21/19 1742 77     Resp 09/21/19 1742 16     Temp 09/21/19 1742 98.6 F (37 C)     Temp Source 09/21/19 1742 Oral     SpO2 09/21/19 1742 100 %     Weight 09/21/19 1743 185 lb (83.9 kg)     Height 09/21/19 1743 5\' 6"  (1.676 m)     Head Circumference --      Peak Flow --      Pain Score 09/21/19 1743 0     Pain Loc --      Pain Edu? --      Excl. in GC? --    No data found.  Updated Vital Signs BP (!) 94/54   Pulse 77   Temp 98.6 F (37 C) (Oral)   Resp 16   Ht 5\' 6"  (1.676 m)   Wt 185 lb (83.9 kg)   SpO2 100%   BMI 29.86 kg/m   Visual Acuity Right Eye Distance:   Left Eye Distance:   Bilateral Distance:    Right Eye Near:   Left Eye Near:    Bilateral Near:     Physical Exam Vitals and nursing note reviewed.  Constitutional:      General: She is not in acute distress.    Appearance: She is well-developed. She is not ill-appearing.  HENT:     Head: Normocephalic and atraumatic.     Nose: Congestion and rhinorrhea present.  Eyes:     Conjunctiva/sclera: Conjunctivae normal.  Cardiovascular:     Rate and Rhythm: Normal rate and regular rhythm.     Heart sounds: No murmur heard.   Pulmonary:     Effort: Pulmonary effort is normal. No respiratory distress.     Breath sounds: Normal breath sounds. No wheezing, rhonchi or rales.  Abdominal:     Palpations: Abdomen is soft.     Tenderness: There is no abdominal tenderness. There is no right CVA tenderness or left CVA tenderness.  Musculoskeletal:     Cervical back: Neck supple.  Skin:    General: Skin is warm and dry.  Neurological:     Mental Status: She is alert.      UC Treatments / Results  Labs (all labs ordered are listed, but only abnormal results are displayed) Labs Reviewed  POCT URINALYSIS DIPSTICK, ED / UC - Abnormal;  Notable for the following components:      Result Value   Ketones, ur TRACE (*)    Protein, ur 30 (*)     All other components within normal limits  SARS CORONAVIRUS 2 (TAT 6-24 HRS)  URINE CULTURE  HIV ANTIBODY (ROUTINE TESTING W REFLEX)  RPR  POC URINE PREG, ED  CERVICOVAGINAL ANCILLARY ONLY    EKG   Radiology No results found.  Procedures Procedures (including critical care time)  Medications Ordered in UC Medications - No data to display  Initial Impression / Assessment and Plan / UC Course  I have reviewed the triage vital signs and the nursing notes.  Pertinent labs & imaging results that were available during my care of the patient were reviewed by me and considered in my medical decision making (see chart for details).     #Vaginal discharge #Viral illness Patient is 24 year old presenting with viral illness after Covid exposure and vaginal discharge.  Well-appearing in clinic today.  Reassuring exam.  We will treat her symptomatically.  Covid sent.  Urinalysis negative for infection.  Culture sent.  Cytology swab sent.  HIV and RPR sent.  Discussed return, follow-up and emergency part precautions.  Patient verbalized understand plan of care Final Clinical Impressions(s) / UC Diagnoses   Final diagnoses:  Vaginal discharge  Viral illness     Discharge Instructions     Use the medicines and take the cough medicine as prescribed  We have sent your test and will call you if we need to add treatments  Hydrate well  If severe symptoms of shortness breath, severe abdominal pain, severe diarrhea, severe vomiting feel he may faint go to the emergency department  If your Covid-19 test is positive, you will receive a phone call from [facility] regarding your results. Negative test results are not called. Both positive and negative results area always visible on MyChart. If you do not have a MyChart account, sign up instructions are in your discharge papers.   Persons who are directed to care for themselves at home may discontinue isolation under the following  conditions:  . At least 10 days have passed since symptom onset and . At least 24 hours have passed without running a fever (this means without the use of fever-reducing medications) and . Other symptoms have improved.  Persons infected with COVID-19 who never develop symptoms may discontinue isolation and other precautions 10 days after the date of their first positive COVID-19 test.       ED Prescriptions    Medication Sig Dispense Auth. Provider   fluticasone (FLONASE) 50 MCG/ACT nasal spray Place 1 spray into both nostrils daily. 15.8 mL Lorence Nagengast, Veryl Speak, PA-C   benzonatate (TESSALON) 100 MG capsule Take 1 capsule (100 mg total) by mouth every 8 (eight) hours. 21 capsule Sahan Pen, Veryl Speak, PA-C     PDMP not reviewed this encounter.   Hermelinda Medicus, PA-C 09/21/19 1909

## 2019-09-21 NOTE — Discharge Instructions (Signed)
Use the medicines and take the cough medicine as prescribed  We have sent your test and will call you if we need to add treatments  Hydrate well  If severe symptoms of shortness breath, severe abdominal pain, severe diarrhea, severe vomiting feel he may faint go to the emergency department  If your Covid-19 test is positive, you will receive a phone call from [facility] regarding your results. Negative test results are not called. Both positive and negative results area always visible on MyChart. If you do not have a MyChart account, sign up instructions are in your discharge papers.   Persons who are directed to care for themselves at home may discontinue isolation under the following conditions:   At least 10 days have passed since symptom onset and  At least 24 hours have passed without running a fever (this means without the use of fever-reducing medications) and  Other symptoms have improved.  Persons infected with COVID-19 who never develop symptoms may discontinue isolation and other precautions 10 days after the date of their first positive COVID-19 test.

## 2019-09-22 ENCOUNTER — Telehealth (HOSPITAL_COMMUNITY): Payer: Self-pay

## 2019-09-22 LAB — SARS CORONAVIRUS 2 (TAT 6-24 HRS): SARS Coronavirus 2: NEGATIVE

## 2019-09-22 LAB — CERVICOVAGINAL ANCILLARY ONLY
Bacterial Vaginitis (gardnerella): POSITIVE — AB
Candida Glabrata: NEGATIVE
Candida Vaginitis: NEGATIVE
Chlamydia: NEGATIVE
Comment: NEGATIVE
Comment: NEGATIVE
Comment: NEGATIVE
Comment: NEGATIVE
Comment: NEGATIVE
Comment: NORMAL
Neisseria Gonorrhea: NEGATIVE
Trichomonas: NEGATIVE

## 2019-09-22 LAB — URINE CULTURE: Culture: <10000 — AB

## 2019-09-22 LAB — RPR: RPR Ser Ql: NONREACTIVE

## 2019-09-22 MED ORDER — METRONIDAZOLE 500 MG PO TABS: 500.0000 mg | ORAL_TABLET | Freq: Two times a day (BID) | ORAL | 0 refills | Status: DC

## 2019-10-06 IMAGING — US US MFM OB FOLLOW-UP
1 series · 12 of 28 positions shown · non-contrast
Comparison: none

[Series 1: us mfm ob follow-up · 64 acquisitions, 12 frames shown]
[im 3/64]
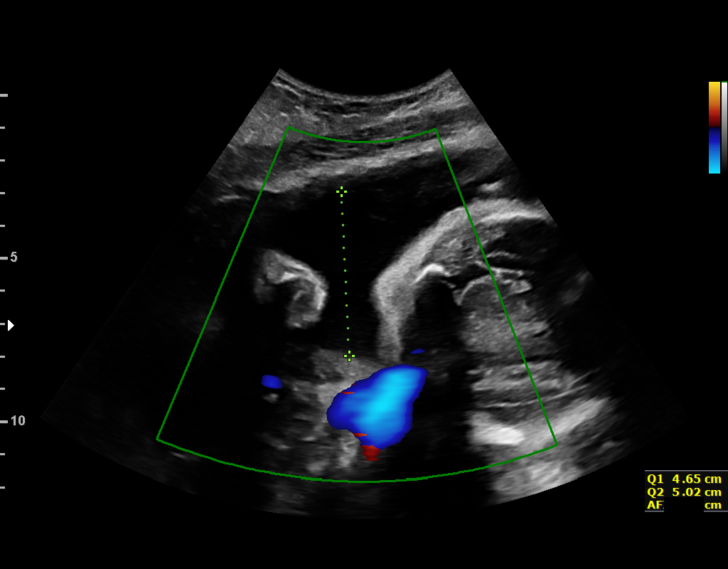
[im 8/64]
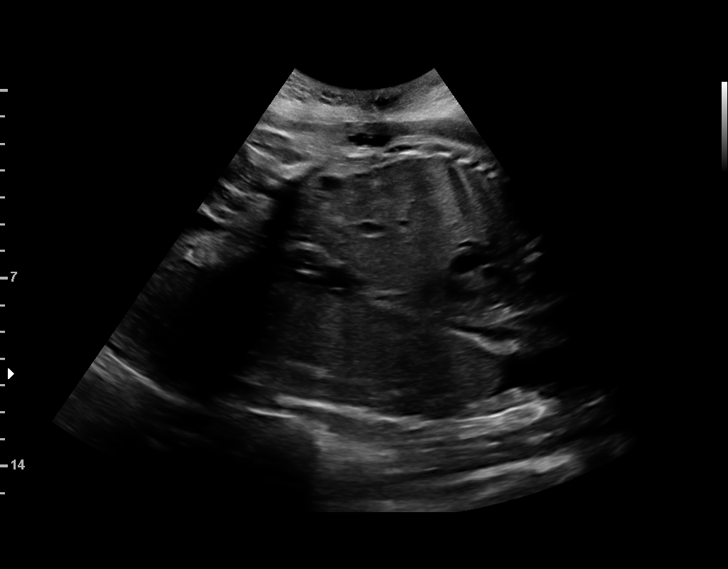
[im 12/64]
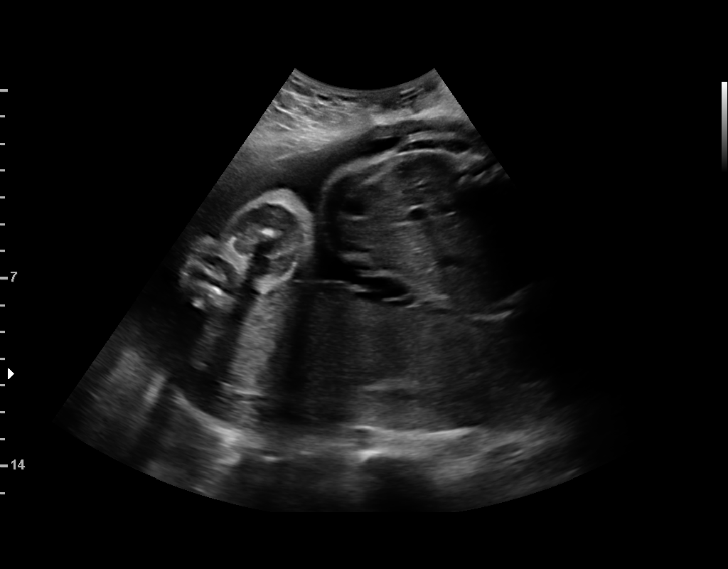
[im 19/64]
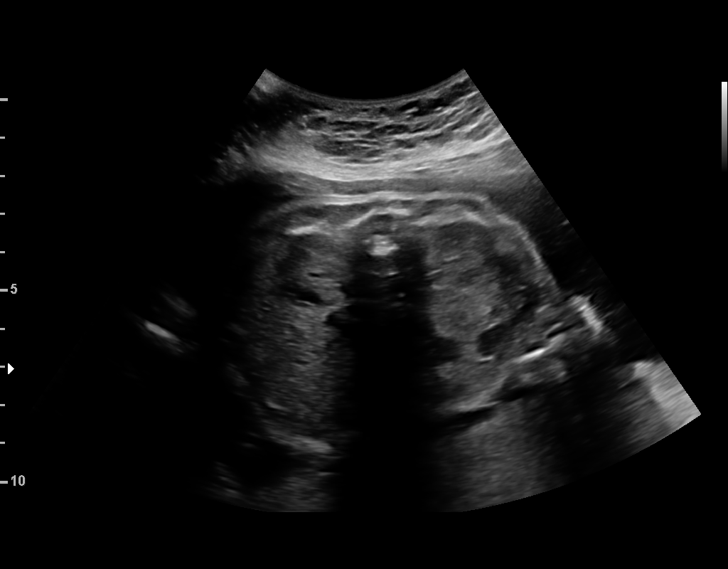
[im 24/64]
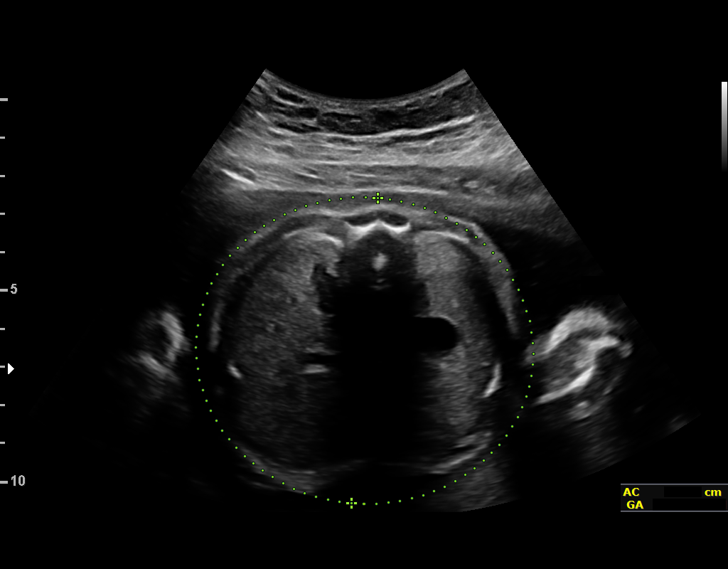
[im 29/64]
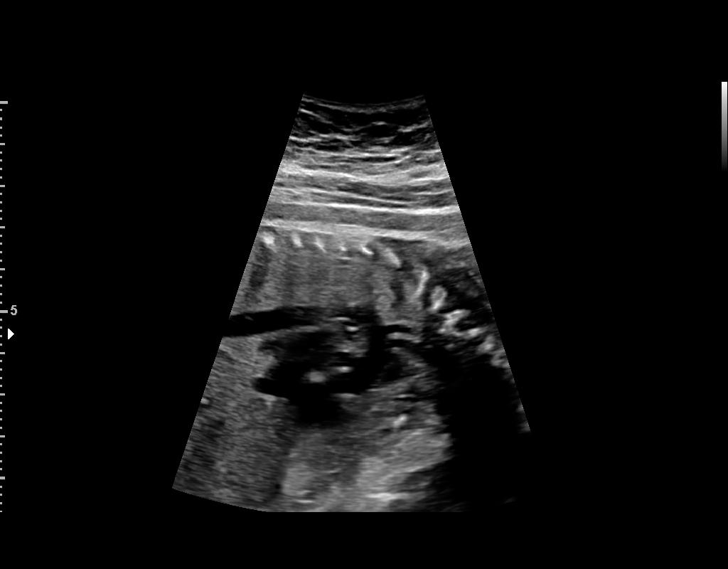
[im 36/64]
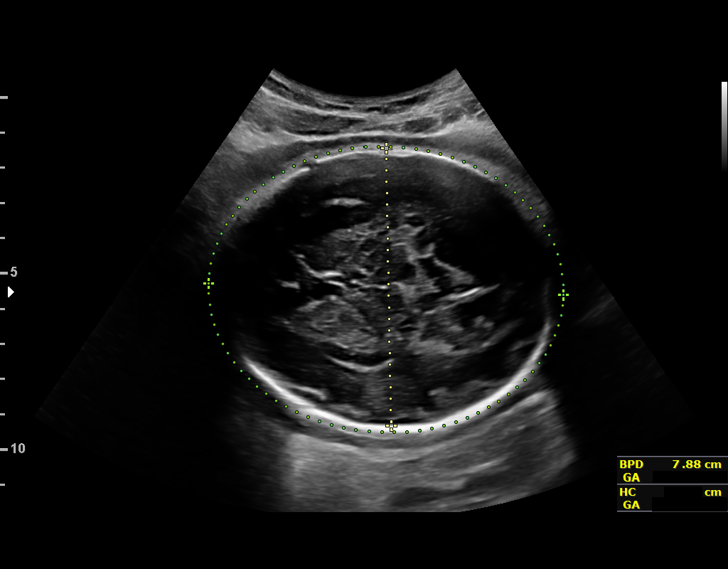
[im 40/64]
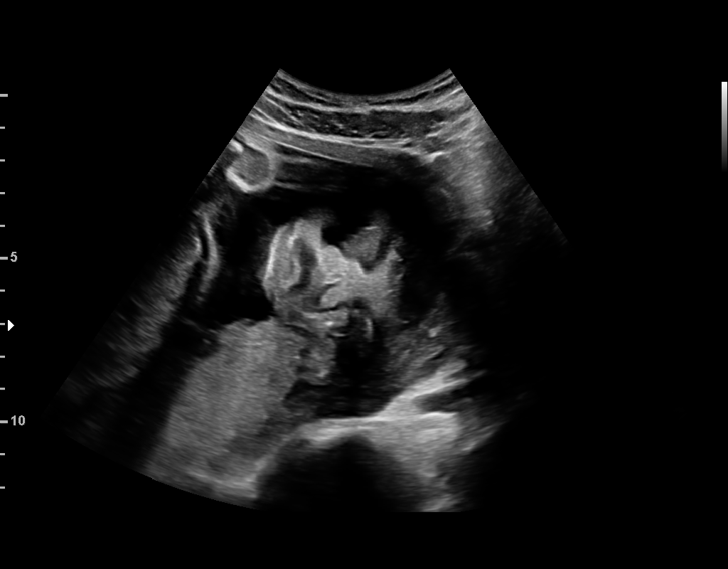
[im 45/64]
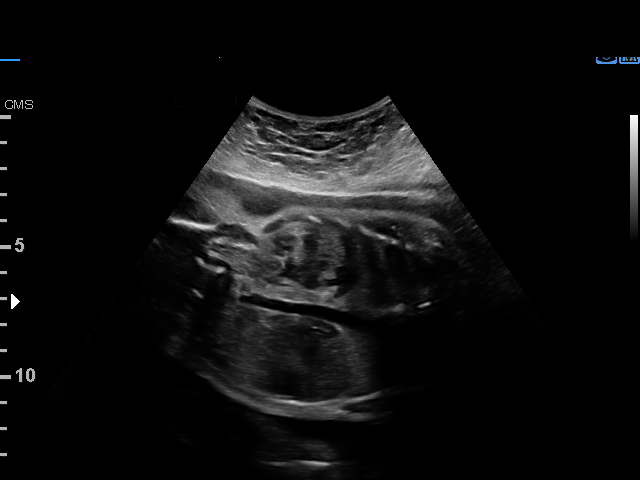
[im 52/64]
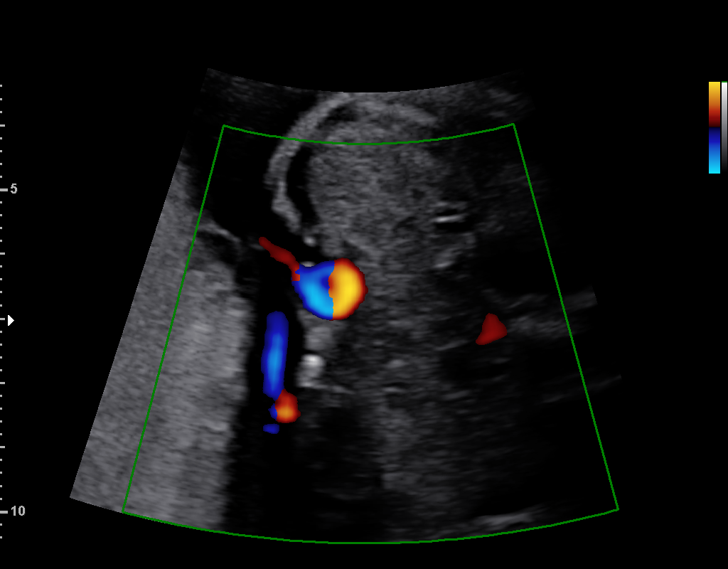
[im 57/64]
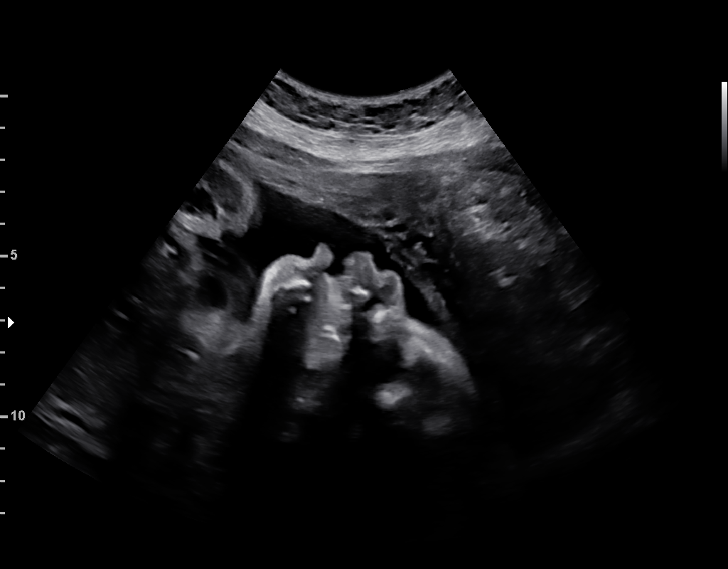
[im 61/64]
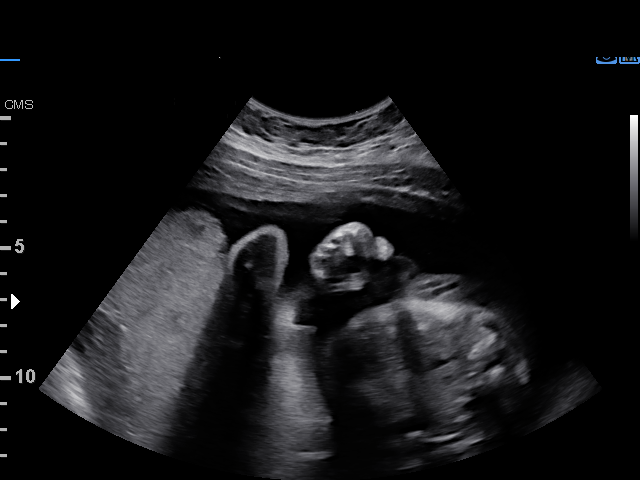

[12 of 28 positions shown; findings below may reference images not displayed]

[facility]

OB/Gyn Clinic
[REDACTED]

1  SUSANNA MAIGNAN              [id number]      [id number]     [id number]
2  DERIC NELMS            [id number]      [id number]     [id number]
Indications

31 weeks gestation of pregnancy
Poor obstetric history: Previous IUFD
(stillbirth) 34 weeks
Umbilical vein abnormality complicating
pregnancy: UVV
OB History

Gravidity:    3          SAB:   1
Living:       0
Fetal Evaluation

Num Of Fetuses:     1
Fetal Heart         149
Rate(bpm):
Cardiac Activity:   Observed
Presentation:       Cephalic
Placenta:           Posterior, above cervical os
P. Cord Insertion:  Visualized
Amniotic Fluid
AFI FV:      Subjectively within normal limits

AFI Sum(cm)     %Tile       Largest Pocket(cm)
17.06           63

RUQ(cm)       RLQ(cm)       LUQ(cm)        LLQ(cm)
4.65
Biophysical Evaluation

Amniotic F.V:   Within normal limits       F. Tone:        Observed
F. Movement:    Observed                   Score:          [DATE]
F. Breathing:   Observed
Biometry

BPD:      78.9  mm     G. Age:  31w 5d         51  %    CI:        74.97   %    70 - 86
FL/HC:      19.5   %    19.3 -
HC:      289.1  mm     G. Age:  31w 6d         28  %    HC/AC:      1.11        0.96 -
AC:      261.3  mm     G. Age:  30w 2d         20  %    FL/BPD:     71.5   %    71 - 87
FL:       56.4  mm     G. Age:  29w 5d          6  %    FL/AC:      21.6   %    20 - 24
HUM:      50.8  mm     G. Age:  29w 5d         19  %

Est. FW:    9991  gm      3 lb 7 oz     36  %
Gestational Age

LMP:           31w 2d        Date:  05/16/16                 EDD:   02/20/17
U/S Today:     30w 6d                                        EDD:   02/23/17
Best:          31w 2d     Det. By:  LMP  (05/16/16)          EDD:   02/20/17
Anatomy

Cranium:               Appears normal         Aortic Arch:            Appears normal
Cavum:                 Appears normal         Ductal Arch:            Previously seen
Ventricles:            Appears normal         Diaphragm:              Appears normal
Choroid Plexus:        Appears normal         Stomach:                Appears normal, left
sided
Cerebellum:            Appears normal         Abdomen:                Appears normal
Posterior Fossa:       Previously seen        Abdominal Wall:         Previously seen
Nuchal Fold:           Previously seen        Cord Vessels:           Appears normal (3
vessel cord)
Face:                  Appears normal         Kidneys:                Appear normal
(orbits and profile)
Lips:                  Appears normal         Bladder:                Appears normal
Thoracic:              Appears normal         Spine:                  Previously seen
Heart:                 Previously seen        Upper Extremities:      Previously seen
RVOT:                  Previously seen        Lower Extremities:      Previously seen
LVOT:                  Previously seen

Other:  Nasal bone visualized. 5th digit previously visualized. Male gender
previously visualized. Heels previously visualized. Technically difficult
due to fetal position.
Cervix Uterus Adnexa

Cervix
Not visualized (advanced GA >50wks)
Uterus
No abnormality visualized.

Left Ovary
Within normal limits.

Right Ovary
Within normal limits.

Cul De Sac:   No free fluid seen.

Adnexa:       No abnormality visualized.
Impression

SIUP at 31+2 weeks
UVV - no filling defects
All other interval fetal anatomy was seen and appeared
normal; anatomic survey complete
Normal amniotic fluid volume
Appropriate interval growth with EFW at the 36th %tile
BPP [DATE]
Recommendations

Continue twice weekly assessements of UVV; also h/o IUFD
at 37 weeks

## 2019-10-20 ENCOUNTER — Other Ambulatory Visit: Payer: Self-pay

## 2019-10-20 ENCOUNTER — Encounter (HOSPITAL_COMMUNITY): Payer: Self-pay

## 2019-10-20 ENCOUNTER — Ambulatory Visit (HOSPITAL_COMMUNITY)
Admission: EM | Admit: 2019-10-20 | Discharge: 2019-10-20 | Disposition: A | Discharge status: Home / Self Care | Payer: Medicaid Other | Attending: Family Medicine | Admitting: Family Medicine

## 2019-10-20 DIAGNOSIS — R05 Cough: Secondary | ICD-10-CM | POA: Diagnosis not present

## 2019-10-20 DIAGNOSIS — J069 Acute upper respiratory infection, unspecified: Secondary | ICD-10-CM | POA: Diagnosis not present

## 2019-10-20 DIAGNOSIS — F17210 Nicotine dependence, cigarettes, uncomplicated: Secondary | ICD-10-CM | POA: Insufficient documentation

## 2019-10-20 DIAGNOSIS — U071 COVID-19: Secondary | ICD-10-CM | POA: Diagnosis not present

## 2019-10-20 DIAGNOSIS — R439 Unspecified disturbances of smell and taste: Secondary | ICD-10-CM | POA: Diagnosis not present

## 2019-10-20 NOTE — ED Triage Notes (Signed)
Pt states she loss sense of taste/smell 2 days ago and has had a cough x 1 day. Pt states no fever. Pt is aox4 and ambulatory.

## 2019-10-20 NOTE — ED Provider Notes (Signed)
[facility]   [id number] 10/20/19 Arrival Time: 1020  ASSESSMENT & PLAN:  1. Viral URI with cough      COVID-19 testing sent. See letter/work note on file for self-isolation guidelines. OTC symptom care as needed.    Follow-up Information    [facility].   Specialty: Internal Medicine Why: As needed. Contact information: [address] [place] [address] [phone]               Reviewed expectations re: course of current medical issues. Questions answered. Outlined signs and symptoms indicating need for more acute intervention. Understanding verbalized. After Visit Summary given.   SUBJECTIVE: History from: patient. Destiny Wu is a 24 y.o. female who presents with worries regarding COVID-19. Known COVID-19 contact: possible exposure. Recent travel: none. Reports: loss of taste/smell and dry cough for two days. Denies: fever and difficulty breathing. Normal PO intake without n/v/d.    OBJECTIVE:  Vitals:   10/20/19 1309  BP: 107/69  Pulse: 81  Resp: 18  Temp: 98 F (36.7 C)  TempSrc: Oral  SpO2: 98%    General appearance: alert; no distress Eyes: PERRLA; EOMI; conjunctiva normal HENT: [place]; AT; with nasal congestion Neck: supple  Lungs: speaks full sentences without difficulty; unlabored Extremities: no edema Skin: warm and dry Neurologic: normal gait Psychological: alert and cooperative; normal mood and affect  Labs:  Labs Reviewed  SARS CORONAVIRUS 2 (TAT 6-24 HRS)      No Active Allergies  Past Medical History:  Diagnosis Date  . UTI (urinary tract infection)    Social History   Socioeconomic History  . Marital status: Single    Spouse name: Not on file  . Number of children: Not on file  . Years of education: Not on file  . Highest education level: Not on file  Occupational History  . Not on file  Tobacco Use  . Smoking status: Current Every Day Smoker    Packs/day: 0.50    Years: 1.00     Pack years: 0.50    Types: Cigarettes    Last attempt to quit: 01/30/2016    Years since quitting: 3.7  . Smokeless tobacco: Never Used  Vaping Use  . Vaping Use: Never used  Substance and Sexual Activity  . Alcohol use: No  . Drug use: No  . Sexual activity: Yes    Birth control/protection: None  Other Topics Concern  . Not on file  Social History Narrative  . Not on file   Social Determinants of Health   Financial Resource Strain:   . Difficulty of Paying Living Expenses: Not on file  Food Insecurity:   . Worried About Programme researcher, broadcasting/film/video in the Last Year: Not on file  . Ran Out of Food in the Last Year: Not on file  Transportation Needs:   . Lack of Transportation (Medical): Not on file  . Lack of Transportation (Non-Medical): Not on file  Physical Activity:   . Days of Exercise per Week: Not on file  . Minutes of Exercise per Session: Not on file  Stress:   . Feeling of Stress : Not on file  Social Connections:   . Frequency of Communication with Friends and Family: Not on file  . Frequency of Social Gatherings with Friends and Family: Not on file  . Attends Religious Services: Not on file  . Active Member of Clubs or Organizations: Not on file  . Attends Banker Meetings: Not on file  .  Marital Status: Not on file  Intimate Partner Violence:   . Fear of Current or Ex-Partner: Not on file  . Emotionally Abused: Not on file  . Physically Abused: Not on file  . Sexually Abused: Not on file   Family History  Problem Relation Age of Onset  . Diabetes Maternal Grandmother   . Asthma Maternal Grandmother   . Hypertension Maternal Grandmother   . Asthma Mother   . Asthma Maternal Grandfather   . Diabetes Maternal Grandfather   . Hypertension Maternal Grandfather    Past Surgical History:  Procedure Laterality Date  . NO PAST SURGERIES       Mardella Layman, MD 10/20/19 1419

## 2019-10-20 NOTE — Discharge Instructions (Signed)
You have been tested for COVID-19 today. °If your test returns positive, you will receive a phone call from  regarding your results. °Negative test results are not called. °Both positive and negative results area always visible on MyChart. °If you do not have a MyChart account, sign up instructions are provided in your discharge papers. °Please do not hesitate to contact us should you have questions or concerns. ° °

## 2019-10-21 LAB — SARS CORONAVIRUS 2 (TAT 6-24 HRS): SARS Coronavirus 2: POSITIVE — AB

## 2019-10-22 ENCOUNTER — Telehealth: Payer: Self-pay | Admitting: Infectious Diseases

## 2019-10-22 NOTE — Telephone Encounter (Signed)
Called to Discuss with patient about Covid symptoms and the use of the monoclonal antibody infusion for those with mild to moderate Covid symptoms and at a high risk of hospitalization.     Pt appears to qualify for this infusion due to co-morbid conditions and/or a member of an at-risk group in accordance with the FDA Emergency Use Authorization.    Unable to reach pt - LVM and sent MyChart message.   Sx started 3 days ago now from chart records ans qualifiers include social risks, BMI > 25.     Rexene Alberts, MSN, NP-C [facility]  [email or web address] Pager: [phone] Office: [phone] [facility] Main Line: [phone]

## 2020-11-18 ENCOUNTER — Encounter (HOSPITAL_COMMUNITY): Payer: Self-pay | Admitting: Emergency Medicine

## 2020-11-18 ENCOUNTER — Other Ambulatory Visit: Payer: Self-pay

## 2020-11-18 ENCOUNTER — Ambulatory Visit (HOSPITAL_COMMUNITY)
Admission: EM | Admit: 2020-11-18 | Discharge: 2020-11-18 | Disposition: A | Discharge status: Home / Self Care | Payer: Medicaid Other | Attending: Family Medicine | Admitting: Family Medicine

## 2020-11-18 DIAGNOSIS — R110 Nausea: Secondary | ICD-10-CM

## 2020-11-18 DIAGNOSIS — Z3202 Encounter for pregnancy test, result negative: Secondary | ICD-10-CM

## 2020-11-18 LAB — POCT URINALYSIS DIPSTICK, ED / UC
Glucose, UA: NEGATIVE mg/dL
Leukocytes,Ua: NEGATIVE
Nitrite: NEGATIVE
Protein, ur: NEGATIVE mg/dL
Specific Gravity, Urine: >=1.03 (ref 1.005–1.030)
Urobilinogen, UA: 0.2 mg/dL (ref 0.0–1.0)
pH: 5.5 (ref 5.0–8.0)

## 2020-11-18 LAB — POC URINE PREG, ED: Preg Test, Ur: NEGATIVE

## 2020-11-18 MED ORDER — ONDANSETRON 4 MG PO TBDP: 4.0000 mg | ORAL_TABLET | Freq: Three times a day (TID) | ORAL | 0 refills | Status: DC | PRN

## 2020-11-18 NOTE — ED Triage Notes (Signed)
Pt is present today with nausea. Pt states that her sx started last

## 2020-11-19 LAB — CERVICOVAGINAL ANCILLARY ONLY
Bacterial Vaginitis (gardnerella): POSITIVE — AB
Candida Glabrata: NEGATIVE
Candida Vaginitis: POSITIVE — AB
Chlamydia: NEGATIVE
Comment: NEGATIVE
Comment: NEGATIVE
Comment: NEGATIVE
Comment: NEGATIVE
Comment: NEGATIVE
Comment: NORMAL
Neisseria Gonorrhea: NEGATIVE
Trichomonas: POSITIVE — AB

## 2020-11-20 ENCOUNTER — Telehealth (HOSPITAL_COMMUNITY): Payer: Self-pay | Admitting: Emergency Medicine

## 2020-11-20 MED ORDER — METRONIDAZOLE 500 MG PO TABS: 500.0000 mg | ORAL_TABLET | Freq: Two times a day (BID) | ORAL | 0 refills | Status: DC

## 2020-11-20 MED ORDER — FLUCONAZOLE 150 MG PO TABS: 150.0000 mg | ORAL_TABLET | Freq: Once | ORAL | 0 refills | Status: AC

## 2020-11-22 NOTE — ED Provider Notes (Signed)
[facility]    CSN: [id number] Arrival date & time: 11/18/20  1951      History   Chief Complaint Chief Complaint  Patient presents with   Nausea    HPI Destiny Wu is a 25 y.o. female.   Presenting today with several day history of nausea, worse this morning. Denies vomiting, fever, chills, diarrhea, abdominal or pelvic pain, urinary sxs. So far not trying anything OTC for sxs. Requesting preg test, LMP was 10/07/20. She is also requesting STI screening, no known exposures or obvious sxs.    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 Authorizing Provider  ondansetron (ZOFRAN ODT) 4 MG disintegrating tablet Take 1 tablet (4 mg total) by mouth every 8 (eight) hours as needed for nausea or vomiting. 11/18/20  Yes Volney American, PA-C  ibuprofen (ADVIL) 200 MG tablet Take 400 mg by mouth every 8 (eight) hours as needed for headache or moderate pain.    [provider]  metroNIDAZOLE (FLAGYL) 500 MG tablet Take 1 tablet (500 mg total) by mouth 2 (two) times daily. 11/20/20   Lamptey, Myrene Galas, MD  triamcinolone (KENALOG) 0.025 % cream Apply 1 application topically 2 (two) times daily.    [provider]  fluticasone  (FLONASE) 50 MCG/ACT nasal spray Place 1 spray into both nostrils daily. 09/21/19 10/20/19  Darr, Edison Nasuti, PA-C    Family History Family History  Problem Relation Age of Onset   Diabetes Maternal Grandmother    Asthma Maternal Grandmother    Hypertension Maternal Grandmother    Asthma Mother    Asthma Maternal Grandfather    Diabetes Maternal Grandfather    Hypertension Maternal Grandfather     Social History Social History   Tobacco Use   Smoking status: Every Day    Packs/day: 0.50    Years: 1.00    Pack years: 0.50    Types: Cigarettes    Last attempt to quit: 01/30/2016    Years since quitting: 4.8   Smokeless tobacco: Never  Vaping Use   Vaping Use: Never used  Substance Use Topics   Alcohol use: No   Drug use: No     Allergies   Patient has no active allergies.   Review of Systems Review of Systems PER HPI   Physical Exam Triage Vital Signs ED Triage Vitals  Enc Vitals Group     BP 11/18/20 2002 124/83     Pulse Rate 11/18/20 2002 (!) 108  Resp 11/18/20 2002 18     Temp 11/18/20 2002 98.7 F (37.1 C)     Temp src --      SpO2 11/18/20 2002 98 %     Weight --      Height --      Head Circumference --      Peak Flow --      Pain Score 11/18/20 2000 0     Pain Loc --      Pain Edu? --      Excl. in [place]? --    No data found.  Updated Vital Signs BP 124/83   Pulse (!) 108   Temp 98.7 F (37.1 C)   Resp 18   LMP 10/07/2020   SpO2 98%   Visual Acuity Right Eye Distance:   Left Eye Distance:   Bilateral Distance:    Right Eye Near:   Left Eye Near:    Bilateral Near:     Physical Exam Vitals and nursing note reviewed.  Constitutional:      Appearance: Normal appearance. She is not ill-appearing.  HENT:     Head: Atraumatic.  Eyes:     Extraocular Movements: Extraocular movements intact.     Conjunctiva/sclera: Conjunctivae normal.  Cardiovascular:     Rate and Rhythm: Normal rate and regular rhythm.     Heart sounds: Normal  heart sounds.  Pulmonary:     Effort: Pulmonary effort is normal.     Breath sounds: Normal breath sounds.  Abdominal:     General: Bowel sounds are normal. There is no distension.     Palpations: Abdomen is soft.     Tenderness: There is no abdominal tenderness. There is no guarding.  Genitourinary:    Comments: GU exam deferred, self swab performed Musculoskeletal:        General: Normal range of motion.     Cervical back: Normal range of motion and neck supple.  Skin:    General: Skin is warm and dry.  Neurological:     Mental Status: She is alert and oriented to person, place, and time.  Psychiatric:        Mood and Affect: Mood normal.        Thought Content: Thought content normal.        Judgment: Judgment normal.     UC Treatments / Results  Labs (all labs ordered are listed, but only abnormal results are displayed) Labs Reviewed  POCT URINALYSIS DIPSTICK, ED / UC - Abnormal; Notable for the following components:      Result Value   Bilirubin Urine SMALL (*)    Ketones, ur TRACE (*)    Hgb urine dipstick TRACE (*)    All other components within normal limits  CERVICOVAGINAL ANCILLARY ONLY - Abnormal; Notable for the following components:   Trichomonas Positive (*)    Bacterial Vaginitis (gardnerella) Positive (*)    Candida Vaginitis Positive (*)    All other components within normal limits  POC URINE PREG, ED    EKG   Radiology No results found.  Procedures Procedures (including critical care time)  Medications Ordered in UC Medications - No data to display  Initial Impression / Assessment and Plan / UC Course  I have reviewed the triage vital signs and the nursing notes.  Pertinent labs & imaging results that were available during my care of the patient were reviewed by me and considered in my medical decision making (see chart for details).  Vitals and exam overall reassuring, urine preg neg. Vaginal swab pending. Treat with zofran and await  remaining results. Work note given. Return for worsening sxs.  Final Clinical Impressions(s) / UC Diagnoses   Final diagnoses:  Nausea  Pregnancy test negative   Discharge Instructions   None    ED Prescriptions     Medication Sig Dispense Auth. Provider   ondansetron (ZOFRAN ODT) 4 MG disintegrating tablet Take 1 tablet (4 mg total) by mouth every 8 (eight) hours as needed for nausea or vomiting. 20 tablet Particia Nearing, [place]      PDMP not reviewed this encounter.   Particia Nearing, [place] 11/22/20 2305

## 2021-05-11 ENCOUNTER — Ambulatory Visit (HOSPITAL_COMMUNITY)
Admission: EM | Admit: 2021-05-11 | Discharge: 2021-05-11 | Disposition: A | Discharge status: Home / Self Care | Payer: Medicaid Other | Attending: Family Medicine | Admitting: Family Medicine

## 2021-05-11 ENCOUNTER — Encounter (HOSPITAL_COMMUNITY): Payer: Self-pay

## 2021-05-11 DIAGNOSIS — Z113 Encounter for screening for infections with a predominantly sexual mode of transmission: Secondary | ICD-10-CM | POA: Diagnosis present

## 2021-05-11 DIAGNOSIS — R350 Frequency of micturition: Secondary | ICD-10-CM | POA: Diagnosis present

## 2021-05-11 LAB — POCT URINALYSIS DIPSTICK, ED / UC
Bilirubin Urine: NEGATIVE
Glucose, UA: NEGATIVE mg/dL
Ketones, ur: NEGATIVE mg/dL
Leukocytes,Ua: NEGATIVE
Nitrite: NEGATIVE
Protein, ur: NEGATIVE mg/dL
Specific Gravity, Urine: >=1.03 (ref 1.005–1.030)
Urobilinogen, UA: 0.2 mg/dL (ref 0.0–1.0)
pH: 6 (ref 5.0–8.0)

## 2021-05-11 LAB — POC URINE PREG, ED: Preg Test, Ur: NEGATIVE

## 2021-05-11 LAB — HIV ANTIBODY (ROUTINE TESTING W REFLEX): HIV Screen 4th Generation wRfx: NONREACTIVE

## 2021-05-11 LAB — RPR
RPR Ser Ql: REACTIVE — AB
RPR Titer: 1:16 {titer}

## 2021-05-11 NOTE — ED Triage Notes (Signed)
Pt presents today requesting an STI screening. Pt denies vaginal sxs, but notes that she has recently had a new partner. Notes some urinary frequency and dysuria within the last 3 days. ? ? ? ?

## 2021-05-11 NOTE — Discharge Instructions (Addendum)
You were seen today for std screening.  ?The blood work and vaginal swab will be resulted tomorrow and if positive for anything we will treat you at that time.  You will see these results in mychart.  ? ?Your urine did not show infection, but I will send for culture and treat if needed.  ? ? ?

## 2021-05-11 NOTE — ED Provider Notes (Signed)
?[place] ? ? ? ?CSN: [id number] ?Arrival date & time: 05/11/21  1019 ? ? ?  ? ?History   ?Chief Complaint ?Chief Complaint  ?Patient presents with  ? s74.5  ? ? ?HPI ?Destiny Wu is a 26 y.o. female.  ? ?Patient is here for STD screening.  ?No known symptoms.  ?Active with a new partner, no condom use.  ?No known exposures.  ?She would like blood work as well today.  ?She is having some urinary frequency x several days.  No abd pain or back pain. Slight dysuria.  ?No fevers/chills.  ?She is currently on her period.  ? ?Past Medical History:  ?Diagnosis Date  ? UTI (urinary tract infection)   ? ? ?Patient Active Problem List  ? Diagnosis Date Noted  ? 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 Eczema 08/16/2016  ? Allergic rhinitis 08/16/2016  ? Group B streptococcal bacteriuria 07/24/2016  ? Supervision of high-risk pregnancy 07/19/2016  ? History of stillbirth in currently pregnant patient 07/19/2016  ? ? ?Past Surgical History:  ?Procedure Laterality Date  ? NO PAST SURGERIES    ? ? ?OB History   ? ? Gravida  ?5  ? Para  ?2  ? Term  ?1  ? Preterm  ?1  ? AB  ?1  ? Living  ?1  ?  ? ? SAB  ?1  ? IAB  ?   ? Ectopic  ?   ? Multiple  ?0  ? Live Births  ?1  ?   ?  ?  ? ? ? ?Home Medications   ? ?Prior to Admission medications   ?Medication Sig Start Date End Date Taking? Authorizing Provider  ?ibuprofen (ADVIL) 200 MG tablet Take 400 mg by mouth every 8 (eight) hours as needed for headache or moderate pain.    [provider]  ?metroNIDAZOLE (FLAGYL) 500 MG tablet Take 1 tablet (500 mg total) by mouth 2 (two) times daily. 11/20/20   Lamptey, Myrene Galas, MD  ?ondansetron (ZOFRAN ODT) 4 MG disintegrating tablet Take 1 tablet (4 mg total) by mouth every 8 (eight) hours as needed for nausea or vomiting. 11/18/20   Volney American, PA-C  ?triamcinolone (KENALOG) 0.025 % cream Apply 1 application topically 2 (two) times daily.    [provider]  ?fluticasone  (FLONASE) 50 MCG/ACT nasal spray Place 1 spray into both nostrils daily. 09/21/19 10/20/19  Darr, Edison Nasuti, PA-C  ? ? ?Family History ?Family History  ?Problem Relation Age of Onset  ? Diabetes Maternal Grandmother   ? Asthma Maternal Grandmother   ? Hypertension Maternal Grandmother   ? Asthma Mother   ? Asthma Maternal Grandfather   ? Diabetes Maternal Grandfather   ? Hypertension Maternal Grandfather   ? ? ?Social History ?Social History  ? ?Tobacco Use  ? Smoking status: Former  ?  Packs/day: 0.50  ?  Years: 1.00  ?  Pack years: 0.50  ?  Types: Cigarettes  ?  Quit date: 11/06/2020  ?  Years since quitting: 0.5  ? Smokeless tobacco: Never  ?Vaping Use  ? Vaping Use: Never used  ?Substance Use Topics  ? Alcohol use: No  ? Drug use: No  ? ? ? ?Allergies   ?Patient has no known allergies. ? ? ?Review of Systems ?Review of Systems  ?Constitutional: Negative.   ?HENT: Negative.    ?Respiratory: Negative.    ?Cardiovascular: Negative.   ?Gastrointestinal: Negative.   ?Endocrine: Negative.   ?Genitourinary:  Positive for dysuria and frequency.  ? ? ?Physical Exam ?Triage Vital Signs ?ED Triage Vitals  ?Enc Vitals Group  ?   BP 05/11/21 1059 118/76  ?   Pulse Rate 05/11/21 1059 69  ?   Resp 05/11/21 1059 17  ?   Temp 05/11/21 1059 98 ?F (36.7 ?C)  ?   Temp Source 05/11/21 1059 Oral  ?   SpO2 05/11/21 1059 98 %  ?   Weight --   ?   Height --   ?   Head Circumference --   ?   Peak Flow --   ?   Pain Score 05/11/21 1102 0  ?   Pain Loc --   ?   Pain Edu? --   ?   Excl. in [place]? --   ? ?No data found. ? ?Updated Vital Signs ?BP 118/76 (BP Location: Right Arm)   Pulse 69   Temp 98 ?F (36.7 ?C) (Oral)   Resp 17   LMP 05/05/2021 (Approximate)   SpO2 98%  ? ?Visual Acuity ?Right Eye Distance:   ?Left Eye Distance:   ?Bilateral Distance:   ? ?Right Eye Near:   ?Left Eye Near:    ?Bilateral Near:    ? ?Physical Exam ?Constitutional:   ?   Appearance: Normal appearance.  ?HENT:  ?   Head: Normocephalic.  ?Cardiovascular:  ?   Rate  and Rhythm: Normal rate and regular rhythm.  ?Pulmonary:  ?   Effort: Pulmonary effort is normal.  ?   Breath sounds: Normal breath sounds.  ?Abdominal:  ?   Palpations: Abdomen is soft.  ?   Tenderness: There is abdominal tenderness in the suprapubic area.  ?Musculoskeletal:     ?   General: Normal range of motion.  ?Skin: ?   General: Skin is warm.  ?Neurological:  ?   General: No focal deficit present.  ?   Mental Status: She is alert.  ?Psychiatric:     ?   Mood and Affect: Mood normal.  ? ? ? ?UC Treatments / Results  ?Labs ?(all labs ordered are listed, but only abnormal results are displayed) ?Labs Reviewed  ?POCT URINALYSIS DIPSTICK, ED / UC - Abnormal; Notable for the following components:  ?    Result Value  ? Hgb urine dipstick MODERATE (*)   ? All other components within normal limits  ?URINE CULTURE  ?HIV ANTIBODY (ROUTINE TESTING W REFLEX)  ?RPR  ?POC URINE PREG, ED  ?CERVICOVAGINAL ANCILLARY ONLY  ? ? ?EKG ? ? ?Radiology ?No results found. ? ?Procedures ?Procedures (including critical care time) ? ?Medications Ordered in UC ?Medications - No data to display ? ?Initial Impression / Assessment and Plan / UC Course  ?I have reviewed the triage vital signs and the nursing notes. ? ?Pertinent labs & imaging results that were available during my care of the patient were reviewed by me and considered in my medical decision making (see chart for details). ? ?  ? ?Final Clinical Impressions(s) / UC Diagnoses  ? ?Final diagnoses:  ?Screening for STD (sexually transmitted disease)  ?Urinary frequency  ? ? ? ?Discharge Instructions   ? ?  ?You were seen today for std screening.  ?The blood work and vaginal swab will be resulted tomorrow and if positive for anything we will treat you at that time.  You will see these results in mychart.  ? ?Your urine did not show infection, but I will send for culture and treat if needed.  ? ? ? ? ? ?  ED Prescriptions   ?None ?  ? ?PDMP not reviewed this encounter. ?  ?Rondel Oh, MD ?05/11/21 1131 ? ?

## 2021-05-12 ENCOUNTER — Telehealth (HOSPITAL_COMMUNITY): Payer: Self-pay | Admitting: Emergency Medicine

## 2021-05-12 LAB — CERVICOVAGINAL ANCILLARY ONLY
Bacterial Vaginitis (gardnerella): POSITIVE — AB
Candida Glabrata: NEGATIVE
Candida Vaginitis: NEGATIVE
Chlamydia: NEGATIVE
Comment: NEGATIVE
Comment: NEGATIVE
Comment: NEGATIVE
Comment: NEGATIVE
Comment: NEGATIVE
Comment: NORMAL
Neisseria Gonorrhea: NEGATIVE
Trichomonas: POSITIVE — AB

## 2021-05-12 LAB — URINE CULTURE

## 2021-05-12 LAB — T.PALLIDUM AB, TOTAL: T Pallidum Abs: REACTIVE — AB

## 2021-05-12 MED ORDER — METRONIDAZOLE 500 MG PO TABS: 500.0000 mg | ORAL_TABLET | Freq: Two times a day (BID) | ORAL | 0 refills | Status: DC

## 2021-05-13 ENCOUNTER — Telehealth (HOSPITAL_COMMUNITY): Payer: Self-pay | Admitting: Emergency Medicine

## 2021-05-13 NOTE — Telephone Encounter (Signed)
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   ?Contacted patient by phone.  Verified identity using two identifiers.  Provided positive result.  Reviewed safe sex practices, notifying partners, and refraining from sexual activities for 7 days from time of treatment.  Patient verified understanding, all questions answered.   ?HHS notified ?

## 2022-11-19 ENCOUNTER — Ambulatory Visit (HOSPITAL_COMMUNITY)
Admission: EM | Admit: 2022-11-19 | Discharge: 2022-11-19 | Disposition: A | Discharge status: Home / Self Care | Payer: Medicaid Other | Attending: Family Medicine | Admitting: Family Medicine

## 2022-11-19 ENCOUNTER — Ambulatory Visit: Payer: Self-pay

## 2022-11-19 ENCOUNTER — Encounter (HOSPITAL_COMMUNITY): Payer: Self-pay

## 2022-11-19 DIAGNOSIS — R109 Unspecified abdominal pain: Secondary | ICD-10-CM | POA: Insufficient documentation

## 2022-11-19 DIAGNOSIS — J039 Acute tonsillitis, unspecified: Secondary | ICD-10-CM | POA: Insufficient documentation

## 2022-11-19 DIAGNOSIS — J029 Acute pharyngitis, unspecified: Secondary | ICD-10-CM | POA: Insufficient documentation

## 2022-11-19 LAB — POCT RAPID STREP A (OFFICE): Rapid Strep A Screen: NEGATIVE

## 2022-11-19 MED ORDER — AMOXICILLIN 875 MG PO TABS: 875.0000 mg | ORAL_TABLET | Freq: Two times a day (BID) | ORAL | 0 refills | Status: AC

## 2022-11-19 NOTE — ED Triage Notes (Addendum)
Patient states Swollen Lymph Nodes, Stomach Pain x 3 days. Patient having some nausea, no emesis/diarrhea, some constipation. No dietary or medication changes. No one with similar symptoms. No known sick exposure.   Patient had a white bump underneath her tongue 3-4 days ago. States yesterday she noticed it was not there anymore. The area is painful making it hard for her to eat.

## 2022-11-19 NOTE — Discharge Instructions (Addendum)
You were diagnosed with a tonsillitis today.  I have sent out an antibiotic to take twice/day x 7 days.  You may use motrin/tylenol for pain.  Please follow up if not improving or worsening, or see your primary care provider.

## 2022-11-19 NOTE — ED Provider Notes (Signed)
[facility]    CSN: [id number] Arrival date & time: 11/19/22  1740      History   Chief Complaint Chief Complaint  Patient presents with   Abdominal Pain   Sore Throat    HPI Destiny Wu is a 27 y.o. female.    Abdominal Pain Associated symptoms: sore throat   Associated symptoms: no chills and no fever   Sore Throat Associated symptoms include abdominal pain.   Patient is here for several issues.  3-4 days ago she had pain at the right neck/jaw with eating.  Yesterday she noted when she lifts her tongue she noted a white/clear bump.  She did take tylenol for pain.  She has not been eating due to the pain at her neck She is having nausea as well, some cramping along the mid abdomen.   No fevers/chills.  No urinary symptoms.  No diarrhea, some constipation.  No sick contacts.        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 Authorizing Provider  ibuprofen (ADVIL) 200 MG tablet Take 400 mg by mouth every 8 (eight) hours as needed for headache or moderate pain.    [provider]  metroNIDAZOLE (FLAGYL) 500 MG tablet Take 1 tablet (500 mg total) by mouth 2 (two) times daily. 05/12/21   Lamptey, Britta Mccreedy, MD  triamcinolone  (KENALOG) 0.025 % cream Apply 1 application topically 2 (two) times daily.    [provider]  fluticasone (FLONASE) 50 MCG/ACT nasal spray Place 1 spray into both nostrils daily. 09/21/19 10/20/19  Darr, Gerilyn Pilgrim, PA-C    Family History Family History  Problem Relation Age of Onset   Diabetes Maternal Grandmother    Asthma Maternal Grandmother    Hypertension Maternal Grandmother    Asthma Mother    Asthma Maternal Grandfather    Diabetes Maternal Grandfather    Hypertension Maternal Grandfather     Social History Social History   Tobacco Use   Smoking status: Former    Current packs/day: 0.00    Average packs/day: 0.5 packs/day for 1 year (0.5 ttl pk-yrs)    Types: Cigarettes    Start date: 11/07/2019    Quit date: 11/06/2020    Years since quitting: 2.0   Smokeless tobacco: Never  Vaping Use   Vaping status: Never Used  Substance Use Topics   Alcohol use: No   Drug use: No     Allergies   Patient has no known allergies.  Review of Systems Review of Systems  Constitutional:  Negative for chills and fever.  HENT:  Positive for sore throat. Negative for congestion and rhinorrhea.   Eyes: Negative.   Respiratory: Negative.    Cardiovascular: Negative.   Gastrointestinal:  Positive for abdominal pain.  Musculoskeletal: Negative.   Psychiatric/Behavioral: Negative.       Physical Exam Triage Vital Signs ED Triage Vitals  Encounter Vitals Group     BP 11/19/22 1757 103/70     Systolic BP Percentile --      Diastolic BP Percentile --      Pulse Rate 11/19/22 1757 85     Resp 11/19/22 1757 18     Temp 11/19/22 1757 98.7 F (37.1 C)     Temp Source 11/19/22 1757 Oral     SpO2 11/19/22 1757 96 %     Weight --      Height 11/19/22 1756 5\' 6"  (1.676 m)     Head Circumference --      Peak Flow --      Pain Score 11/19/22 1753 7     Pain Loc --      Pain Education --      Exclude from Growth Chart --    No data found.  Updated Vital Signs BP  103/70 (BP Location: Left Arm)   Pulse 85   Temp 98.7 F (37.1 C) (Oral)   Resp 18   Ht 5\' 6"  (1.676 m)   LMP 11/04/2022 (Approximate)   SpO2 96%   BMI 29.86 kg/m   Visual Acuity Right Eye Distance:   Left Eye Distance:   Bilateral Distance:    Right Eye Near:   Left Eye Near:    Bilateral Near:     Physical Exam Constitutional:      Appearance: She is well-developed.  HENT:     Nose: No congestion or rhinorrhea.     Mouth/Throat:     Mouth: Mucous membranes are moist.     Pharynx: Posterior oropharyngeal erythema present. No oropharyngeal exudate.     Tonsils: 1+ on the right.  Cardiovascular:     Rate and Rhythm: Normal rate and regular rhythm.  Abdominal:     Palpations: Abdomen is soft.     Comments: Slight tenderness just above the umbilicus  Musculoskeletal:     Cervical back: Normal range of motion and neck supple.  Lymphadenopathy:     Cervical: Cervical adenopathy present.  Neurological:     General: No focal deficit present.     Mental Status: She is alert.  Psychiatric:        Mood and Affect: Mood normal.      UC Treatments / Results  Labs (all labs ordered are listed, but only abnormal results are displayed) Labs Reviewed  CULTURE, GROUP A STREP [facility])  POCT RAPID STREP A (OFFICE)    EKG   Radiology No results found.  Procedures Procedures (including critical care time)  Medications Ordered in UC Medications - No data to display  Initial Impression / Assessment and Plan / UC Course  I have reviewed the triage vital signs and the nursing notes.  Pertinent labs & imaging results that were available during my care of the patient were reviewed by me and considered in my medical decision making (see chart for details).    Final Clinical Impressions(s) / UC Diagnoses   Final diagnoses:  Sore throat  Acute tonsillitis, unspecified etiology  Abdominal pain, unspecified abdominal location  Discharge Instructions      You were  diagnosed with a tonsillitis today.  I have sent out an antibiotic to take twice/day x 7 days.  You may use motrin/tylenol for pain.  Please follow up if not improving or worsening, or see your primary care provider.     ED Prescriptions     Medication Sig Dispense Auth. Provider   amoxicillin (AMOXIL) 875 MG tablet Take 1 tablet (875 mg total) by mouth 2 (two) times daily for 7 days. 14 tablet Jannifer Franklin, MD      PDMP not reviewed this encounter.   Jannifer Franklin, MD 11/19/22 [phone]

## 2022-11-21 LAB — CULTURE, GROUP A STREP (THRC)

## 2023-03-12 ENCOUNTER — Emergency Department (HOSPITAL_COMMUNITY)
Admission: EM | Admit: 2023-03-12 | Discharge: 2023-03-13 | Discharge status: Against Medical Advice | Payer: Medicaid Other | Attending: Emergency Medicine | Admitting: Emergency Medicine

## 2023-03-12 DIAGNOSIS — K0889 Other specified disorders of teeth and supporting structures: Secondary | ICD-10-CM | POA: Insufficient documentation

## 2023-03-12 DIAGNOSIS — Z5321 Procedure and treatment not carried out due to patient leaving prior to being seen by health care provider: Secondary | ICD-10-CM | POA: Insufficient documentation

## 2023-03-12 NOTE — ED Triage Notes (Signed)
 X 2 days , right upper tooth, has dentist appt on Thursday pt reports pain is too bad to wait until then.

## 2023-03-13 NOTE — ED Notes (Signed)
 Pt has been called 3x , no response.

## 2023-03-29 ENCOUNTER — Ambulatory Visit: Payer: Self-pay

## 2023-03-30 ENCOUNTER — Ambulatory Visit

## 2023-03-30 ENCOUNTER — Encounter: Payer: Self-pay | Admitting: Emergency Medicine

## 2023-03-30 ENCOUNTER — Ambulatory Visit
Admission: EM | Admit: 2023-03-30 | Discharge: 2023-03-30 | Disposition: A | Discharge status: Home / Self Care | Attending: Internal Medicine | Admitting: Internal Medicine

## 2023-03-30 DIAGNOSIS — M542 Cervicalgia: Secondary | ICD-10-CM

## 2023-03-30 DIAGNOSIS — M25572 Pain in left ankle and joints of left foot: Secondary | ICD-10-CM

## 2023-03-30 DIAGNOSIS — M546 Pain in thoracic spine: Secondary | ICD-10-CM

## 2023-03-30 MED ORDER — CYCLOBENZAPRINE HCL 5 MG PO TABS: 5.0000 mg | ORAL_TABLET | Freq: Two times a day (BID) | ORAL | 0 refills | Status: AC | PRN
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
## 2023-03-30 NOTE — Discharge Instructions (Signed)
 X-rays are pending.  I will call if it is abnormal.  Ace wrap applied to your left ankle.  Take this off to sleep.  Elevate ankle and apply ice.  I also prescribed you a muscle relaxer to take as needed for your neck and back pain.  Please be advised that it can make you drowsy so do not drive or drink alcohol with taking it.  Follow-up with orthopedist if pain persists or worsens.
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
## 2023-10-12 ENCOUNTER — Ambulatory Visit: Payer: Self-pay

## 2023-10-17 ENCOUNTER — Other Ambulatory Visit: Payer: Self-pay

## 2023-10-17 ENCOUNTER — Ambulatory Visit
Admission: EM | Admit: 2023-10-17 | Discharge: 2023-10-17 | Disposition: A | Discharge status: Home / Self Care | Attending: Family Medicine | Admitting: Family Medicine

## 2023-10-17 DIAGNOSIS — N898 Other specified noninflammatory disorders of vagina: Secondary | ICD-10-CM | POA: Insufficient documentation

## 2023-10-17 DIAGNOSIS — M722 Plantar fascial fibromatosis: Secondary | ICD-10-CM | POA: Insufficient documentation

## 2023-10-17 DIAGNOSIS — Z113 Encounter for screening for infections with a predominantly sexual mode of transmission: Secondary | ICD-10-CM | POA: Insufficient documentation

## 2023-10-17 MED ORDER — NAPROXEN 500 MG PO TABS: 500.0000 mg | ORAL_TABLET | Freq: Two times a day (BID) | ORAL | 0 refills | Status: AC | PRN

## 2023-10-17 NOTE — ED Provider Notes (Signed)
 [facility]    CSN: [id number] Arrival date & time: 10/17/23  9066      History   Chief Complaint No chief complaint on file.   HPI Ernestine Rohman is a 28 y.o. female dents for foot pain and vaginal discharge.  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  Reports over the past 2 days she has had pain to the bottom of her left foot that extends to the arch of the foot.  No injury.  Denies swelling bruising or numbness or tingling.  States she is to cheer when she was younger and always had issues with this foot.  Pain is worse when she first gets up.  No history of surgeries.  In addition she reports a week of vaginal discharge that is neither pruritic or malodorous.  No dysuria, fevers, nausea/vomiting, flank pain.  No known STD exposure but would like screening.  No OTC medications have been used for any of the symptoms.  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 Authorizing Provider  naproxen  (NAPROSYN ) 500 MG tablet Take 1 tablet (500 mg total) by mouth 2 (two) times daily as needed. 10/17/23  Yes Azelyn Batie, Jodi R, NP  cyclobenzaprine  (FLEXERIL ) 5 MG tablet Take 1 tablet (5 mg total) by mouth 2 (two)  times daily as needed for muscle spasms. 03/30/23   Hazen Darryle BRAVO, FNP  fluticasone  (FLONASE ) 50 MCG/ACT nasal spray Place 1 spray into both nostrils daily. 09/21/19 10/20/19  Darr, Lang, PA-C    Family History Family History  Problem Relation Age of Onset   Diabetes Maternal Grandmother    Asthma Maternal Grandmother    Hypertension Maternal Grandmother    Asthma Mother    Asthma Maternal Grandfather    Diabetes Maternal Grandfather    Hypertension Maternal Grandfather     Social History Social History   Tobacco Use   Smoking status: Every Day    Types: Cigars   Smokeless tobacco: Never  Vaping Use   Vaping status: Never Used  Substance Use Topics   Alcohol use: No   Drug use: No     Allergies   Patient has no known allergies.   Review of Systems Review of Systems  Genitourinary:  Positive for vaginal discharge.  Musculoskeletal:        Left foot pain     Physical Exam Triage Vital Signs ED Triage Vitals  Encounter  Vitals Group     BP 10/17/23 0950 113/69     Girls Systolic BP Percentile --      Girls Diastolic BP Percentile --      Boys Systolic BP Percentile --      Boys Diastolic BP Percentile --      Pulse Rate 10/17/23 0950 64     Resp 10/17/23 0950 16     Temp 10/17/23 0950 98.5 F (36.9 C)     Temp Source 10/17/23 0950 Oral     SpO2 10/17/23 0950 98 %     Weight --      Height --      Head Circumference --      Peak Flow --      Pain Score 10/17/23 0949 10     Pain Loc --      Pain Education --      Exclude from Growth Chart --    No data found.  Updated Vital Signs BP 113/69   Pulse 64   Temp 98.5 F (36.9 C) (Oral)   Resp 16   LMP 10/02/2023   SpO2 98%   Visual Acuity Right Eye Distance:   Left Eye Distance:   Bilateral Distance:    Right Eye Near:   Left Eye Near:    Bilateral Near:     Physical Exam Vitals and nursing note reviewed.  Constitutional:      General: She is not in acute distress.    Appearance: Normal  appearance. She is not ill-appearing.  HENT:     Head: Normocephalic and atraumatic.  Eyes:     Pupils: Pupils are equal, round, and reactive to light.  Cardiovascular:     Rate and Rhythm: Normal rate.  Pulmonary:     Effort: Pulmonary effort is normal.  Musculoskeletal:       Feet:  Feet:     Comments: There is no swelling or ecchymosis of the left foot.  There is tenderness along the plantar fascia that extends slightly to the medial foot.  No tenderness to dorsal aspect of foot.  DP +2. Skin:    General: Skin is warm and dry.  Neurological:     General: No focal deficit present.     Mental Status: She is alert and oriented to person, place, and time.  Psychiatric:        Mood and Affect: Mood normal.        Behavior: Behavior normal.      UC Treatments / Results  Labs (all labs ordered are listed, but only abnormal results are displayed) Labs Reviewed  RPR  HIV ANTIBODY (ROUTINE TESTING W REFLEX)  CERVICOVAGINAL ANCILLARY ONLY    EKG   Radiology No results found.  Procedures Procedures (including critical care time)  Medications Ordered in UC Medications - No data to display  Initial Impression / Assessment and Plan / UC Course  I have reviewed the triage vital signs and the nursing notes.  Pertinent labs & imaging results that were available during my care of the patient were reviewed by me and considered in my medical decision making (see chart for details).     Reviewed exam and symptoms with patient.  Vaginal swab/STD testing is ordered and will contact for any positive results.  Discussed possible plantar fasciitis given exam, advised to get over-the-counter shoe inserts and will do trial of naproxen  twice daily as needed.  Also discussed rolling her foot over cold can or water bottle in  the morning to stretch out the fascia.  Advised PCP follow-up if the symptoms do not improve.  ER precautions reviewed Final Clinical Impressions(s) / UC Diagnoses    Final diagnoses:  Vaginal discharge  Screening examination for STD (sexually transmitted disease)  Plantar fasciitis of left foot     Discharge Instructions      The clinic will contact you with results of the STD testing done today if positive.  Start naproxen  twice daily for 7 days as needed for your foot pain.  Advise you get an over-the-counter shoe insert to help support the arch of your foot.  You may also use a cold can or water bottle in the mornings to roll your foot over to help stretch out your plantar fascia.  Please follow-up with your PCP if your symptoms do not improve.  Please go to the ER for any worsening symptoms.  Hope you feel better soon!    ED Prescriptions     Medication Sig Dispense Auth. Provider   naproxen  (NAPROSYN ) 500 MG tablet Take 1 tablet (500 mg total) by mouth 2 (two) times daily as needed. 7 tablet Aspynn Clover, Jodi R, NP      PDMP not reviewed this encounter.   Loreda Myla SAUNDERS, NP 10/17/23 1014

## 2023-10-17 NOTE — Discharge Instructions (Signed)
 The clinic will contact you with results of the STD testing done today if positive.  Start naproxen  twice daily for 7 days as needed for your foot pain.  Advise you get an over-the-counter shoe insert to help support the arch of your foot.  You may also use a cold can or water bottle in the mornings to roll your foot over to help stretch out your plantar fascia.  Please follow-up with your PCP if your symptoms do not improve.  Please go to the ER for any worsening symptoms.  Hope you feel better soon!

## 2023-10-17 NOTE — ED Triage Notes (Addendum)
 Pt c/o left foot painx2d. Pt denies injury. Pt states she is on her feet a lot for her job. Pt states the foot is painful near the arch of her foot. Pt c/o brown vaginal dischargex1wk. Pt requesting STI testing

## 2023-10-18 ENCOUNTER — Ambulatory Visit (HOSPITAL_COMMUNITY): Payer: Self-pay

## 2023-10-18 LAB — CERVICOVAGINAL ANCILLARY ONLY
Bacterial Vaginitis (gardnerella): POSITIVE — AB
Candida Glabrata: NEGATIVE
Candida Vaginitis: NEGATIVE
Chlamydia: POSITIVE — AB
Comment: NEGATIVE
Comment: NEGATIVE
Comment: NEGATIVE
Comment: NEGATIVE
Comment: NEGATIVE
Comment: NORMAL
Neisseria Gonorrhea: NEGATIVE
Trichomonas: NEGATIVE

## 2023-10-18 MED ORDER — METRONIDAZOLE 500 MG PO TABS: 500.0000 mg | ORAL_TABLET | Freq: Two times a day (BID) | ORAL | 0 refills | Status: AC

## 2023-10-18 MED ORDER — DOXYCYCLINE HYCLATE 100 MG PO TABS: 100.0000 mg | ORAL_TABLET | Freq: Two times a day (BID) | ORAL | 0 refills | Status: AC

## 2023-10-19 LAB — RPR, QUANT+TP ABS (REFLEX)
Rapid Plasma Reagin, Quant: 1:4 {titer} — ABNORMAL HIGH
T Pallidum Abs: REACTIVE — AB

## 2023-10-19 LAB — HIV ANTIBODY (ROUTINE TESTING W REFLEX): HIV Screen 4th Generation wRfx: NONREACTIVE

## 2023-10-19 LAB — RPR: RPR Ser Ql: REACTIVE — AB
